# Patient Record
Sex: Male | Born: 1995 | Race: White | Hispanic: No | Smoking: Former smoker
Health system: Southern US, Community
[De-identification: ages and names within clinical notes are randomized; demographics above are authoritative.]

## PROBLEM LIST (undated history)

## (undated) DIAGNOSIS — L400 Psoriasis vulgaris: Secondary | ICD-10-CM

## (undated) DIAGNOSIS — F319 Bipolar disorder, unspecified: Secondary | ICD-10-CM

## (undated) DIAGNOSIS — F32A Depression, unspecified: Secondary | ICD-10-CM

## (undated) DIAGNOSIS — F329 Major depressive disorder, single episode, unspecified: Secondary | ICD-10-CM

## (undated) DIAGNOSIS — J45909 Unspecified asthma, uncomplicated: Secondary | ICD-10-CM

## (undated) HISTORY — PX: TONSILLECTOMY: SUR1361

## (undated) HISTORY — DX: Major depressive disorder, single episode, unspecified: F32.9

## (undated) HISTORY — PX: WISDOM TOOTH EXTRACTION: SHX21

## (undated) HISTORY — DX: Depression, unspecified: F32.A

## (undated) HISTORY — PX: ADENOIDECTOMY: SHX5191

## (undated) HISTORY — DX: Psoriasis vulgaris: L40.0

---
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
## 2017-02-07 ENCOUNTER — Encounter (HOSPITAL_COMMUNITY): Payer: Self-pay | Admitting: Emergency Medicine

## 2017-02-07 ENCOUNTER — Ambulatory Visit (HOSPITAL_COMMUNITY)
Admission: EM | Admit: 2017-02-07 | Discharge: 2017-02-07 | Disposition: A | Discharge status: Home / Self Care | Payer: Medicaid Other | Attending: Emergency Medicine | Admitting: Emergency Medicine

## 2017-02-07 ENCOUNTER — Ambulatory Visit (INDEPENDENT_AMBULATORY_CARE_PROVIDER_SITE_OTHER): Payer: Medicaid Other

## 2017-02-07 DIAGNOSIS — S8002XA Contusion of left knee, initial encounter: Secondary | ICD-10-CM | POA: Diagnosis not present

## 2017-02-07 DIAGNOSIS — M549 Dorsalgia, unspecified: Secondary | ICD-10-CM | POA: Diagnosis not present

## 2017-02-07 DIAGNOSIS — S7002XA Contusion of left hip, initial encounter: Secondary | ICD-10-CM | POA: Diagnosis not present

## 2017-02-07 DIAGNOSIS — M25562 Pain in left knee: Secondary | ICD-10-CM | POA: Diagnosis not present

## 2017-02-07 HISTORY — DX: Bipolar disorder, unspecified: F31.9
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
## 2017-05-06 DIAGNOSIS — L409 Psoriasis, unspecified: Secondary | ICD-10-CM | POA: Insufficient documentation

## 2017-08-29 DIAGNOSIS — F319 Bipolar disorder, unspecified: Secondary | ICD-10-CM | POA: Diagnosis not present

## 2017-09-30 ENCOUNTER — Emergency Department (HOSPITAL_COMMUNITY)
Admission: EM | Admit: 2017-09-30 | Discharge: 2017-09-30 | Disposition: A | Discharge status: Home / Self Care | Payer: BLUE CROSS/BLUE SHIELD | Attending: Emergency Medicine | Admitting: Emergency Medicine

## 2017-09-30 ENCOUNTER — Encounter (HOSPITAL_COMMUNITY): Payer: Self-pay | Admitting: Emergency Medicine

## 2017-09-30 DIAGNOSIS — R0902 Hypoxemia: Secondary | ICD-10-CM | POA: Diagnosis not present

## 2017-09-30 DIAGNOSIS — T40601A Poisoning by unspecified narcotics, accidental (unintentional), initial encounter: Secondary | ICD-10-CM | POA: Diagnosis not present

## 2017-09-30 DIAGNOSIS — R739 Hyperglycemia, unspecified: Secondary | ICD-10-CM

## 2017-09-30 DIAGNOSIS — J45909 Unspecified asthma, uncomplicated: Secondary | ICD-10-CM | POA: Diagnosis not present

## 2017-09-30 DIAGNOSIS — F17210 Nicotine dependence, cigarettes, uncomplicated: Secondary | ICD-10-CM | POA: Insufficient documentation

## 2017-09-30 DIAGNOSIS — Z79899 Other long term (current) drug therapy: Secondary | ICD-10-CM | POA: Diagnosis not present

## 2017-09-30 DIAGNOSIS — R0689 Other abnormalities of breathing: Secondary | ICD-10-CM | POA: Diagnosis not present

## 2017-09-30 DIAGNOSIS — I454 Nonspecific intraventricular block: Secondary | ICD-10-CM | POA: Diagnosis not present

## 2017-09-30 DIAGNOSIS — R402441 Other coma, without documented Glasgow coma scale score, or with partial score reported, in the field [EMT or ambulance]: Secondary | ICD-10-CM | POA: Diagnosis not present

## 2017-09-30 LAB — CBC WITH DIFFERENTIAL/PLATELET
ABS IMMATURE GRANULOCYTES: 0.1 10*3/uL (ref 0.0–0.1)
Basophils Absolute: 0.1 10*3/uL (ref 0.0–0.1)
Basophils Relative: 0 %
EOS ABS: 0.1 10*3/uL (ref 0.0–0.7)
Eosinophils Relative: 1 %
HEMATOCRIT: 49.3 % (ref 39.0–52.0)
HEMOGLOBIN: 16 g/dL (ref 13.0–17.0)
IMMATURE GRANULOCYTES: 1 %
LYMPHS ABS: 1.3 10*3/uL (ref 0.7–4.0)
Lymphocytes Relative: 7 %
MCH: 28.2 pg (ref 26.0–34.0)
MCHC: 32.5 g/dL (ref 30.0–36.0)
MCV: 86.8 fL (ref 78.0–100.0)
MONOS PCT: 6 %
Monocytes Absolute: 1.1 10*3/uL — ABNORMAL HIGH (ref 0.1–1.0)
NEUTROS ABS: 14.9 10*3/uL — AB (ref 1.7–7.7)
NEUTROS PCT: 85 %
PLATELETS: 239 10*3/uL (ref 150–400)
RBC: 5.68 MIL/uL (ref 4.22–5.81)
RDW: 12.8 % (ref 11.5–15.5)
WBC: 17.5 10*3/uL — AB (ref 4.0–10.5)

## 2017-09-30 LAB — ETHANOL: Alcohol, Ethyl (B): 180 mg/dL — ABNORMAL HIGH (ref <10)

## 2017-09-30 LAB — RAPID URINE DRUG SCREEN, HOSP PERFORMED
Amphetamines: NOT DETECTED
Barbiturates: NOT DETECTED
Benzodiazepines: NOT DETECTED
Cocaine: POSITIVE — AB
OPIATES: POSITIVE — AB
TETRAHYDROCANNABINOL: POSITIVE — AB

## 2017-09-30 LAB — BASIC METABOLIC PANEL
Anion gap: 13 (ref 5–15)
BUN: 7 mg/dL (ref 6–20)
CO2: 24 mmol/L (ref 22–32)
CREATININE: 1.19 mg/dL (ref 0.61–1.24)
Calcium: 8.8 mg/dL — ABNORMAL LOW (ref 8.9–10.3)
Chloride: 105 mmol/L (ref 101–111)
GFR calc Af Amer: >60 mL/min (ref >60)
Glucose, Bld: 212 mg/dL — ABNORMAL HIGH (ref 65–99)
Potassium: 3.4 mmol/L — ABNORMAL LOW (ref 3.5–5.1)
Sodium: 142 mmol/L (ref 135–145)

## 2017-09-30 MED ORDER — SODIUM CHLORIDE 0.9 % IV BOLUS
1000.0000 mL | Freq: Once | INTRAVENOUS | Status: AC
  Administered 2017-09-30: 1000 mL via INTRAVENOUS

## 2017-09-30 MED ORDER — ONDANSETRON HCL 4 MG/2ML IJ SOLN
4.0000 mg | Freq: Once | INTRAMUSCULAR | Status: AC
  Administered 2017-09-30: 4 mg via INTRAVENOUS
  Filled 2017-09-30: qty 2

## 2017-09-30 NOTE — ED Provider Notes (Signed)
[facility]   CSN: [id number] Arrival date & time: 09/30/17  0710     History   Chief Complaint Chief Complaint  Patient presents with  . Drug Overdose    HPI Kyle Woodward is a 22 y.o. male.  Level 5 caveat for urgent need for intervention.  Patient is status post heroin inhalation.  His friends found him after he fell out of bed and was unconscious.  EMS was notified.  He was given Narcan 1 mg intranasally and bagged for a few minutes.  Patient became more conscious en route to the hospital.  He also reports drinking alcohol last night.  He is more alert now     Past Medical History:  Diagnosis Date  . Asthma   . Bipolar 1 disorder (HCC)     There are no active problems to display for this patient.   Past Surgical History:  Procedure Laterality Date  . ADENOIDECTOMY    . TONSILLECTOMY          Home Medications    Prior to Admission medications   Medication Sig Start Date End Date Taking? Authorizing Provider  Adalimumab 40 MG/0.4ML PNKT Inject 40 mg into the skin every 14 (fourteen) days. 06/26/17  Yes [provider]  hydrOXYzine (ATARAX/VISTARIL) 25 MG tablet Take 25 mg by mouth daily as needed. anxiety 08/29/17   [provider]    Family History Family History  Problem Relation Age of Onset  . Diabetes Mother   . Hypertension Mother     Social History Social History   Tobacco Use  . Smoking status: Current Every Day Smoker    Types: Cigarettes  . Smokeless tobacco: Current User  Substance Use Topics  . Alcohol use: Yes    Comment: socially  . Drug use: Yes    Types: Marijuana     Allergies   Zithromax [azithromycin]   Review of Systems Review of Systems  All other systems reviewed and are negative.    Physical Exam Updated Vital Signs BP 115/72   Pulse 92   Temp 98 F (36.7 C) (Oral)   Resp 12   Ht 6\' 1"  (1.854 m)   Wt 82.6 kg (182 lb)   SpO2 99%   BMI  24.01 kg/m   Physical Exam  Constitutional: He is oriented to person, place, and time.  Easily arousable.  HENT:  Head: Normocephalic and atraumatic.  Eyes: Conjunctivae are normal.  Neck: Neck supple.  Cardiovascular: Normal rate and regular rhythm.  Pulmonary/Chest: Effort normal and breath sounds normal.  Abdominal: Soft. Bowel sounds are normal.  Musculoskeletal: Normal range of motion.  Neurological: He is alert and oriented to person, place, and time.  Skin: Skin is warm and dry.  Psychiatric: He has a normal mood and affect. His behavior is normal.  Nursing note and vitals reviewed.    ED Treatments / Results  Labs (all labs ordered are listed, but only abnormal results are displayed) Labs Reviewed  CBC WITH DIFFERENTIAL/PLATELET - Abnormal; Notable for the following components:      Result Value   WBC 17.5 (*)    Neutro Abs 14.9 (*)    Monocytes Absolute 1.1 (*)    All other components within normal limits  BASIC METABOLIC PANEL - Abnormal; Notable for the following components:   Potassium 3.4 (*)    Glucose, Bld 212 (*)    Calcium 8.8 (*)    All other components within normal limits  RAPID URINE DRUG SCREEN, HOSP PERFORMED - Abnormal; Notable for the following components:   Opiates POSITIVE (*)    Cocaine POSITIVE (*)    Tetrahydrocannabinol POSITIVE (*)    All other components within normal limits  ETHANOL - Abnormal; Notable for the following components:   Alcohol, Ethyl (B) 180 (*)    All other components within normal limits    EKG EKG Interpretation  Date/Time:  Monday September 30 2017 07:17:56 EDT Ventricular Rate:  121 PR Interval:    QRS Duration: 108 QT Interval:  329 QTC Calculation: 467 R Axis:   23 Text Interpretation:  Sinus tachycardia LAE, consider biatrial enlargement RSR' in V1 or V2, right VCD or RVH Confirmed by Donnetta Hutching ([id number]) on 09/30/2017 8:39:10 AM   Radiology No results found.  Procedures Procedures (including critical  care time)  Medications Ordered in ED Medications  sodium chloride 0.9 % bolus 1,000 mL (0 mLs Intravenous Stopped 09/30/17 0858)  sodium chloride 0.9 % bolus 1,000 mL (0 mLs Intravenous Stopped 09/30/17 1107)  ondansetron (ZOFRAN) injection 4 mg (4 mg Intravenous Given 09/30/17 1007)     Initial Impression / Assessment and Plan / ED Course  I have reviewed the triage vital signs and the nursing notes.  Pertinent labs & imaging results that were available during my care of the patient were reviewed by me and considered in my medical decision making (see chart for details).     Patient presents after an opiate overdose.  He is now stable in the emergency department.  Drug screen positive for THC, cocaine, heroin.  Alcohol level 180.  He was given IV fluids and prolonged observation.  Glucose elevated at 212.  These findings were discussed with the patient.  He was hemodynamically stable at discharge.  Final Clinical Impressions(s) / ED Diagnoses   Final diagnoses:  Opiate overdose, accidental or unintentional, initial encounter [facility])  Hyperglycemia    ED Discharge Orders    None       Donnetta Hutching, MD 10/02/17 1040

## 2017-09-30 NOTE — ED Notes (Signed)
PT asleep. PT is able to be awakened by slight touch on shoulder. PT alert and oriented.

## 2017-09-30 NOTE — ED Triage Notes (Signed)
PT snorted heroin this AM. PT's friends heard him fall out of bed. They found PT unconscious and called EMS. EMS found PT unconscious with shallow respirations. PT was given 1mg  narcan intranasally and bagged for a few minutes. PT became conscious in route. Alert and oriented x4. Also reports he had "a lot" to drink last night.

## 2017-09-30 NOTE — ED Notes (Signed)
Kandace BlitzJohn Chapman ; friend ; [phone] ; call when PT is released, he is his ride home.

## 2017-09-30 NOTE — ED Notes (Signed)
88% on room air. PT placed on O2 via [place] 2L

## 2017-09-30 NOTE — ED Notes (Signed)
PT placed on 2L O2 via [place] 

## 2017-09-30 NOTE — ED Notes (Signed)
PT wakes to voice. PT able to stand without assistance to provide urine sample. PT is not able to urinate, but does start to vomit. Dr. Adriana Simasook made aware

## 2017-09-30 NOTE — Discharge Instructions (Addendum)
No more heroin.  Your glucose was elevated today.  This will need to be rechecked in the next couple weeks.  Return if worse

## 2017-09-30 NOTE — ED Notes (Signed)
95% on room air.

## 2017-11-28 ENCOUNTER — Encounter: Payer: Self-pay | Admitting: Adult Health

## 2017-11-28 ENCOUNTER — Ambulatory Visit: Payer: BLUE CROSS/BLUE SHIELD | Admitting: Adult Health

## 2017-11-28 VITALS — BP 120/78 | Temp 98.3°F | Wt 178.0 lb

## 2017-11-28 DIAGNOSIS — F1010 Alcohol abuse, uncomplicated: Secondary | ICD-10-CM

## 2017-11-28 DIAGNOSIS — F1910 Other psychoactive substance abuse, uncomplicated: Secondary | ICD-10-CM

## 2017-11-28 DIAGNOSIS — Z7689 Persons encountering health services in other specified circumstances: Secondary | ICD-10-CM

## 2017-11-28 DIAGNOSIS — R7309 Other abnormal glucose: Secondary | ICD-10-CM

## 2017-11-28 DIAGNOSIS — J4520 Mild intermittent asthma, uncomplicated: Secondary | ICD-10-CM

## 2017-11-28 LAB — BASIC METABOLIC PANEL
BUN: 11 mg/dL (ref 6–23)
CHLORIDE: 100 meq/L (ref 96–112)
CO2: 30 meq/L (ref 19–32)
Calcium: 10.6 mg/dL — ABNORMAL HIGH (ref 8.4–10.5)
Creatinine, Ser: 0.86 mg/dL (ref 0.40–1.50)
GFR: 120.57 mL/min (ref >60.00)
GLUCOSE: 83 mg/dL (ref 70–99)
POTASSIUM: 4.4 meq/L (ref 3.5–5.1)
Sodium: 139 mEq/L (ref 135–145)

## 2017-11-28 LAB — HEMOGLOBIN A1C: Hgb A1c MFr Bld: 4.9 % (ref 4.6–6.5)

## 2017-11-28 MED ORDER — ALBUTEROL SULFATE HFA 108 (90 BASE) MCG/ACT IN AERS: 2.0000 | INHALATION_SPRAY | Freq: Four times a day (QID) | RESPIRATORY_TRACT | 0 refills | Status: DC | PRN

## 2017-11-28 NOTE — Progress Notes (Signed)
Patient presents to clinic today to establish care. He is a pleasant 22 year old male who  has a past medical history of Asthma and Bipolar 1 disorder ([place]).    Acute Concerns: Establish Care  Elevated Glucose - glucose was found to be 212 during recent hospital admission.   Chronic Issues: Drug/Alcohol Abuse - ER visit in June 2019 for opiate overdose where he was found unconscious by a friend. He was given 1 mg narcan intranasally by EMS. He became more conscious en route to hospital. Alcohol level was 180. He tested posiitve for THC, Cocaine, and heroin. Was given IV fluids and observation in the ER . Today in the office he reports that this overdose was a wake up call for him. He has not used Heroine or Coke since being discharged. He continues to smoke marijuana and use alcohol. He has not seeked treatment for drug abuse nor does he want to.   Tobacco Use - Smokes less than 1/2 pack a day. Also uses smokeless tobacco   Depression - no longer taking medication. Feels controlled. Was on Zoloft and Depakote in the past. 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 Seen by [facility]. Started Humira in Feb and has noticed significant improvement    Health Maintenance: Dental -- Does not do routine care  Vision -- Does not do routine care Immunizations -- UTD     Past Medical History:  Diagnosis Date  . Asthma   . Bipolar 1 disorder [facility])     Past Surgical History:  Procedure Laterality Date  . ADENOIDECTOMY    . TONSILLECTOMY      Current Outpatient Medications on File Prior to Visit  Medication Sig Dispense Refill  . Adalimumab 40 MG/0.4ML PNKT Inject 40 mg into the skin every 14 (fourteen) days.     No current facility-administered medications on file prior to visit.     Allergies  Allergen Reactions  . Zithromax [Azithromycin] Hives    Family History  Problem Relation Age of Onset  . Diabetes Mother   .  Hypertension Mother     Social History   Socioeconomic History  . Marital status: Single    Spouse name: Not on file  . Number of children: Not on file  . Years of education: Not on file  . Highest education level: Not on file  Occupational History  . Not on file  Social Needs  . Financial resource strain: Not on file  . Food insecurity:    Worry: Not on file    Inability: Not on file  . Transportation needs:    Medical: Not on file    Non-medical: Not on file  Tobacco Use  . Smoking status: Current Every Day Smoker    Types: Cigarettes  . Smokeless tobacco: Current User  Substance and Sexual Activity  . Alcohol use: Yes    Comment: socially  . Drug use: Yes    Types: Marijuana  . Sexual activity: Yes  Lifestyle  . Physical activity:    Days per week: Not on file    Minutes per session: Not on file  . Stress: Not on file  Relationships  . Social connections:    Talks on phone: Not on file    Gets together: Not on file    Attends religious service: Not on file    Active member of club or organization: Not on file    Attends  meetings of clubs or organizations: Not on file    Relationship status: Not on file  . Intimate partner violence:    Fear of current or ex partner: Not on file    Emotionally abused: Not on file    Physically abused: Not on file    Forced sexual activity: Not on file  Other Topics Concern  . Not on file  Social History Narrative  . Not on file    Review of Systems  Constitutional: Negative.   Respiratory: Negative.   Cardiovascular: Negative.   Genitourinary: Negative.   Musculoskeletal: Negative.   Skin: Negative.   Neurological: Negative.   Psychiatric/Behavioral: Negative.      BP 120/78   Temp 98.3 F (36.8 C) (Oral)   Wt 178 lb (80.7 kg)   BMI 23.48 kg/m   Physical Exam  Constitutional: He is oriented to person, place, and time. He appears well-developed and well-nourished. No distress.  Eyes: Pupils are equal, round,  and reactive to light. EOM are normal.  Cardiovascular: Normal rate, regular rhythm, normal heart sounds and intact distal pulses. Exam reveals no gallop and no friction rub.  No murmur heard. Pulmonary/Chest: Effort normal and breath sounds normal. No stridor. No respiratory distress. He has no wheezes. He has no rales. He exhibits no tenderness.  Abdominal: Soft. Bowel sounds are normal. He exhibits no distension and no mass. There is no tenderness. There is no rebound and no guarding. No hernia.  Musculoskeletal: Normal range of motion. He exhibits no edema, tenderness or deformity.  Neurological: He is alert and oriented to person, place, and time.  Skin: Skin is warm and dry. He is not diaphoretic.  Psychiatric: He has a normal mood and affect. His behavior is normal. Judgment and thought content normal.  Nursing note and vitals reviewed.     Recent Results (from the past 2160 hour(s))  CBC with Differential     Status: Abnormal   Collection Time: 09/30/17  7:55 AM  Result Value Ref Range   WBC 17.5 (H) 4.0 - 10.5 K/uL   RBC 5.68 4.22 - 5.81 MIL/uL   Hemoglobin 16.0 13.0 - 17.0 g/dL   HCT 49.3 39.0 - 52.0 %   MCV 86.8 78.0 - 100.0 fL   MCH 28.2 26.0 - 34.0 pg   MCHC 32.5 30.0 - 36.0 g/dL   RDW 12.8 11.5 - 15.5 %   Platelets 239 150 - 400 K/uL   Neutrophils Relative % 85 %   Neutro Abs 14.9 (H) 1.7 - 7.7 K/uL   Lymphocytes Relative 7 %   Lymphs Abs 1.3 0.7 - 4.0 K/uL   Monocytes Relative 6 %   Monocytes Absolute 1.1 (H) 0.1 - 1.0 K/uL   Eosinophils Relative 1 %   Eosinophils Absolute 0.1 0.0 - 0.7 K/uL   Basophils Relative 0 %   Basophils Absolute 0.1 0.0 - 0.1 K/uL   Immature Granulocytes 1 %   Abs Immature Granulocytes 0.1 0.0 - 0.1 K/uL    Comment: Performed at [facility], [address], [place], [place] [address]  Basic metabolic panel     Status: Abnormal   Collection Time: 09/30/17  7:55 AM  Result Value Ref Range   Sodium 142 135 - 145 mmol/L   Potassium  3.4 (L) 3.5 - 5.1 mmol/L   Chloride 105 101 - 111 mmol/L   CO2 24 22 - 32 mmol/L   Glucose, Bld 212 (H) 65 - 99 mg/dL   BUN 7  6 - 20 mg/dL   Creatinine, Ser 1.19 0.61 - 1.24 mg/dL   Calcium 8.8 (L) 8.9 - 10.3 mg/dL   GFR calc non Af Amer >60 >60 mL/min   GFR calc Af Amer >60 >60 mL/min    Comment: (NOTE) The eGFR has been calculated using the CKD EPI equation. This calculation has not been validated in all clinical situations. eGFR's persistently <60 mL/min signify possible Chronic Kidney Disease.    Anion gap 13 5 - 15    Comment: Performed at [facility] [address], [place], [place] [address]  Ethanol     Status: Abnormal   Collection Time: 09/30/17  7:55 AM  Result Value Ref Range   Alcohol, Ethyl (B) 180 (H) <10 mg/dL    Comment: (NOTE) Lowest detectable limit for serum alcohol is 10 mg/dL. For medical purposes only. Performed at [facility], [place] [address] [place], [place] [address]   Urine rapid drug screen (hosp performed)     Status: Abnormal   Collection Time: 09/30/17 11:57 AM  Result Value Ref Range   Opiates POSITIVE (A) NONE DETECTED   Cocaine POSITIVE (A) NONE DETECTED   Benzodiazepines NONE DETECTED NONE DETECTED   Amphetamines NONE DETECTED NONE DETECTED   Tetrahydrocannabinol POSITIVE (A) NONE DETECTED   Barbiturates NONE DETECTED NONE DETECTED    Comment: (NOTE) DRUG SCREEN FOR MEDICAL PURPOSES ONLY.  IF CONFIRMATION IS NEEDED FOR ANY PURPOSE, NOTIFY LAB WITHIN 5 DAYS. LOWEST DETECTABLE LIMITS FOR URINE DRUG SCREEN Drug Class                     Cutoff (ng/mL) Amphetamine and metabolites    1000 Barbiturate and metabolites    200 Benzodiazepine                 637 Tricyclics and metabolites     300 Opiates and metabolites        300 Cocaine and metabolites        300 THC                            50 Performed at [facility], [place] [address], [place], [place] [address]     Assessment/Plan: 1. Encounter to establish  care - Follow up for CPE sooner if needed -Average lifestyle modifications such as routine exercise and heart healthy diet  2. Elevated glucose - likely from ingestion.  - Basic Metabolic Panel - Hemoglobin A1c  3. Drug abuse ([place]) - He does not want information on substance abuse counseling.  Was encouraged to follow-up if he ever would like to seek treatment.  Encouraged to quit smoking marijuana as this is considered a substance.  Congratulated on being free of heroin and cocaine.  4. Alcohol abuse -Urged to stop drinking alcohol as he is under 21 and this just can lead to other substance abuse.  5. Mild intermittent asthma without complication -Urged to quit recurrence and smokeless tobacco products.  Will provide albuterol inhaler for symptomatic asthma symptoms when needed. - albuterol (PROVENTIL HFA;VENTOLIN HFA) 108 (90 Base) MCG/ACT inhaler; Inhale 2 puffs into the lungs every 6 (six) hours as needed for wheezing or shortness of breath.  Dispense: 1 Inhaler; Refill: 0  Dorothyann Peng, NP\

## 2017-11-28 NOTE — Patient Instructions (Signed)
It was great meeting you today   Please work on quitting smoking and dipping.   I will follow up with you regarding your blood work   Please let me know if you need anything

## 2018-01-01 ENCOUNTER — Ambulatory Visit: Payer: BLUE CROSS/BLUE SHIELD | Admitting: Adult Health

## 2018-01-01 DIAGNOSIS — Z0289 Encounter for other administrative examinations: Secondary | ICD-10-CM

## 2018-01-30 ENCOUNTER — Encounter: Payer: Self-pay | Admitting: Adult Health

## 2018-01-30 ENCOUNTER — Ambulatory Visit (INDEPENDENT_AMBULATORY_CARE_PROVIDER_SITE_OTHER): Payer: BLUE CROSS/BLUE SHIELD | Admitting: Adult Health

## 2018-01-30 ENCOUNTER — Telehealth: Payer: Self-pay | Admitting: Adult Health

## 2018-01-30 VITALS — BP 138/82 | HR 92 | Temp 98.6°F | Wt 191.6 lb

## 2018-01-30 DIAGNOSIS — J069 Acute upper respiratory infection, unspecified: Secondary | ICD-10-CM | POA: Diagnosis not present

## 2018-01-30 MED ORDER — PREDNISONE 10 MG PO TABS
ORAL_TABLET | ORAL | 0 refills | Status: DC
Start: 2018-01-30 — End: 2018-03-18

## 2018-01-30 MED ORDER — DOXYCYCLINE HYCLATE 100 MG PO CAPS: 100.0000 mg | ORAL_CAPSULE | Freq: Two times a day (BID) | ORAL | 0 refills | Status: DC

## 2018-01-30 NOTE — Telephone Encounter (Signed)
Delsym or Mucinex is fine. He was given prednisone which will help with the cough.

## 2018-01-30 NOTE — Progress Notes (Signed)
Subjective:    Patient ID: Kyle Woodward, male    DOB: 08-22-1995, 22 y.o.   MRN: [id number]  URI   This is a new problem. The current episode started in the past 7 days. The problem has been gradually worsening. The maximum temperature recorded prior to his arrival was 100.4 - 100.9 F (subjective ). Associated symptoms include congestion, coughing (productive ), rhinorrhea and wheezing. Pertinent negatives include no nausea, plugged ear sensation or sinus pain. Treatments tried: low dose prednisone left over  The treatment provided no relief.      Review of Systems  Constitutional: Positive for activity change, appetite change, chills, diaphoresis, fatigue and fever.  HENT: Positive for congestion and rhinorrhea. Negative for sinus pressure, sinus pain and trouble swallowing.   Respiratory: Positive for cough (productive ), chest tightness, shortness of breath and wheezing.   Cardiovascular: Negative.   Gastrointestinal: Negative for nausea.   Past Medical History:  Diagnosis Date  . Asthma   . Bipolar 1 disorder ([facility])   . Depression   . Plaque psoriasis     Social History   Socioeconomic History  . Marital status: Single    Spouse name: Not on file  . Number of children: Not on file  . Years of education: Not on file  . Highest education level: Not on file  Occupational History  . Not on file  Social Needs  . Financial resource strain: Not on file  . Food insecurity:    Worry: Not on file    Inability: Not on file  . Transportation needs:    Medical: Not on file    Non-medical: Not on file  Tobacco Use  . Smoking status: Current Every Day Smoker    Types: Cigarettes  . Smokeless tobacco: Never Used  Substance and Sexual Activity  . Alcohol use: Yes    Comment: socially  . Drug use: Yes    Types: Marijuana  . Sexual activity: Yes    Partners: Female  Lifestyle  . Physical activity:    Days per week: Not on file    Minutes per session: Not on file  .  Stress: Not on file  Relationships  . Social connections:    Talks on phone: Not on file    Gets together: Not on file    Attends religious service: Not on file    Active member of club or organization: Not on file    Attends meetings of clubs or organizations: Not on file    Relationship status: Not on file  . Intimate partner violence:    Fear of current or ex partner: Not on file    Emotionally abused: Not on file    Physically abused: Not on file    Forced sexual activity: Not on file  Other Topics Concern  . Not on file  Social History Narrative  . Not on file    Past Surgical History:  Procedure Laterality Date  . ADENOIDECTOMY    . TONSILLECTOMY    . WISDOM TOOTH EXTRACTION      Family History  Problem Relation Age of Onset  . Diabetes Mother   . Hypertension Mother   . Alcohol abuse Mother   . Depression Mother   . Hyperlipidemia Mother   . Alcohol abuse Father   . Depression Father   . Hypertension Father   . Arthritis Maternal Grandmother   . Hyperlipidemia Maternal Grandmother   . Hypertension Maternal Grandmother     Allergies  Allergen Reactions  . Influenza Vaccines     Local reaction to injection site - swelling Overall feeling bad after getting the vaccine  . Zithromax [Azithromycin] Hives    Current Outpatient Medications on File Prior to Visit  Medication Sig Dispense Refill  . Adalimumab 40 MG/0.4ML PNKT Inject 40 mg into the skin every 14 (fourteen) days.    Marland Kitchen albuterol (PROVENTIL HFA;VENTOLIN HFA) 108 (90 Base) MCG/ACT inhaler Inhale 2 puffs into the lungs every 6 (six) hours as needed for wheezing or shortness of breath. 1 Inhaler 0   No current facility-administered medications on file prior to visit.     BP 138/82 (BP Location: Right Arm, Patient Position: Sitting, Cuff Size: Normal)   Pulse 92   Temp 98.6 F (37 C) (Oral)   Wt 191 lb 9.6 oz (86.9 kg)   SpO2 97%   BMI 25.28 kg/m       Objective:   Physical Exam    Constitutional: He is oriented to person, place, and time. He appears well-developed and well-nourished. He appears ill. No distress.  HENT:  Head: Normocephalic and atraumatic.  Right Ear: External ear normal.  Left Ear: External ear normal.  Nose: Nose normal.  Mouth/Throat: Oropharynx is clear and moist. No oropharyngeal exudate.  Eyes: Pupils are equal, round, and reactive to light. Conjunctivae and EOM are normal. Right eye exhibits no discharge. Left eye exhibits no discharge. No scleral icterus.  Cardiovascular: Normal rate, regular rhythm, normal heart sounds and intact distal pulses.  Pulmonary/Chest: Effort normal. No stridor. No respiratory distress. He has wheezes (throughout ). He has no rhonchi. He has no rales. He exhibits no tenderness.  Musculoskeletal: Normal range of motion.  Neurological: He is alert and oriented to person, place, and time.  Skin: Skin is warm and dry. He is not diaphoretic.  Nursing note and vitals reviewed.     Assessment & Plan:  1. Upper respiratory tract infection, unspecified type - We talked about getting a chest xray but it would not change my treatment plan. Decided not to. Will treat with Doxycycline and Prednisone.  - doxycycline (VIBRAMYCIN) 100 MG capsule; Take 1 capsule (100 mg total) by mouth 2 (two) times daily.  Dispense: 14 capsule; Refill: 0 - predniSONE (DELTASONE) 10 MG tablet; 40 mg x 3 days, 20 mg x 3 days, 10 mg x 3 days  Dispense: 21 tablet; Refill: 0 - Follow up in 2-3 days if no improvement    Shirline Frees, NP

## 2018-01-30 NOTE — Telephone Encounter (Signed)
Copied from CRM [id number]. Topic: Quick Communication - See Telephone Encounter >> Jan 30, 2018  3:32 PM Lorayne Bender wrote: CRM for notification. See Telephone encounter for: 01/30/18.  Pt's mother, Judeth Cornfield, calling in.  States that she wants to know what pt can take for his cough.  Pt was seen and given medications, but nothing for cough. Judeth Cornfield can be reached at [phone].

## 2018-01-30 NOTE — Telephone Encounter (Signed)
Spoke to [place] (mother/DPR on file) and informed her to try OTC Delsym or Mucinex.  No further action required.

## 2018-03-12 ENCOUNTER — Encounter (HOSPITAL_COMMUNITY): Payer: Self-pay | Admitting: Emergency Medicine

## 2018-03-12 ENCOUNTER — Emergency Department (HOSPITAL_COMMUNITY)
Admission: EM | Admit: 2018-03-12 | Discharge: 2018-03-12 | Disposition: A | Discharge status: Home / Self Care | Payer: BLUE CROSS/BLUE SHIELD | Attending: Emergency Medicine | Admitting: Emergency Medicine

## 2018-03-12 ENCOUNTER — Encounter: Payer: Self-pay | Admitting: Emergency Medicine

## 2018-03-12 ENCOUNTER — Emergency Department (HOSPITAL_COMMUNITY): Payer: BLUE CROSS/BLUE SHIELD

## 2018-03-12 ENCOUNTER — Other Ambulatory Visit: Payer: Self-pay

## 2018-03-12 DIAGNOSIS — R4182 Altered mental status, unspecified: Secondary | ICD-10-CM | POA: Diagnosis not present

## 2018-03-12 DIAGNOSIS — T401X1A Poisoning by heroin, accidental (unintentional), initial encounter: Secondary | ICD-10-CM

## 2018-03-12 DIAGNOSIS — Y92810 Car as the place of occurrence of the external cause: Secondary | ICD-10-CM | POA: Insufficient documentation

## 2018-03-12 DIAGNOSIS — R4020 Unspecified coma: Secondary | ICD-10-CM | POA: Diagnosis not present

## 2018-03-12 LAB — COMPREHENSIVE METABOLIC PANEL
ALBUMIN: 4.5 g/dL (ref 3.5–5.0)
ALK PHOS: 66 U/L (ref 38–126)
ALT: 18 U/L (ref 0–44)
ANION GAP: 15 (ref 5–15)
AST: 21 U/L (ref 15–41)
BUN: 7 mg/dL (ref 6–20)
CHLORIDE: 102 mmol/L (ref 98–111)
CO2: 18 mmol/L — AB (ref 22–32)
Calcium: 8.8 mg/dL — ABNORMAL LOW (ref 8.9–10.3)
Creatinine, Ser: 1.23 mg/dL (ref 0.61–1.24)
GFR calc non Af Amer: >60 mL/min (ref >60)
GLUCOSE: 282 mg/dL — AB (ref 70–99)
Potassium: 3.2 mmol/L — ABNORMAL LOW (ref 3.5–5.1)
SODIUM: 135 mmol/L (ref 135–145)
Total Bilirubin: 0.4 mg/dL (ref 0.3–1.2)
Total Protein: 7.5 g/dL (ref 6.5–8.1)

## 2018-03-12 LAB — CBC WITH DIFFERENTIAL/PLATELET
Abs Immature Granulocytes: 0.06 10*3/uL (ref 0.00–0.07)
BASOS ABS: 0.1 10*3/uL (ref 0.0–0.1)
BASOS PCT: 1 %
EOS PCT: 1 %
Eosinophils Absolute: 0.1 10*3/uL (ref 0.0–0.5)
HCT: 51.2 % (ref 39.0–52.0)
HEMOGLOBIN: 15.7 g/dL (ref 13.0–17.0)
Immature Granulocytes: 0 %
LYMPHS PCT: 36 %
Lymphs Abs: 5 10*3/uL — ABNORMAL HIGH (ref 0.7–4.0)
MCH: 27.4 pg (ref 26.0–34.0)
MCHC: 30.7 g/dL (ref 30.0–36.0)
MCV: 89.4 fL (ref 80.0–100.0)
MONO ABS: 1.1 10*3/uL — AB (ref 0.1–1.0)
Monocytes Relative: 8 %
NEUTROS ABS: 7.7 10*3/uL (ref 1.7–7.7)
Neutrophils Relative %: 54 %
PLATELETS: 328 10*3/uL (ref 150–400)
RBC: 5.73 MIL/uL (ref 4.22–5.81)
RDW: 13.6 % (ref 11.5–15.5)
WBC: 14 10*3/uL — AB (ref 4.0–10.5)
nRBC: 0 % (ref 0.0–0.2)

## 2018-03-12 LAB — RAPID URINE DRUG SCREEN, HOSP PERFORMED
Amphetamines: NOT DETECTED
BARBITURATES: NOT DETECTED
Benzodiazepines: POSITIVE — AB
COCAINE: POSITIVE — AB
OPIATES: NOT DETECTED
TETRAHYDROCANNABINOL: POSITIVE — AB

## 2018-03-12 LAB — ETHANOL: Alcohol, Ethyl (B): 107 mg/dL — ABNORMAL HIGH (ref <10)

## 2018-03-12 LAB — ACETAMINOPHEN LEVEL: Acetaminophen (Tylenol), Serum: <10 ug/mL — ABNORMAL LOW (ref 10–30)

## 2018-03-12 LAB — SALICYLATE LEVEL: Salicylate Lvl: <7 mg/dL (ref 2.8–30.0)

## 2018-03-12 MED ORDER — NALOXONE HCL 2 MG/2ML IJ SOSY
2.0000 mg | PREFILLED_SYRINGE | Freq: Once | INTRAMUSCULAR | Status: AC
  Administered 2018-03-12: 2 mg via INTRAVENOUS

## 2018-03-12 MED ORDER — NALOXONE HCL 4 MG/0.1ML NA LIQD: NASAL | 0 refills | Status: DC

## 2018-03-12 MED ORDER — SODIUM CHLORIDE 0.9 % IV BOLUS
1000.0000 mL | Freq: Once | INTRAVENOUS | Status: AC
  Administered 2018-03-12: 1000 mL via INTRAVENOUS

## 2018-03-12 MED ORDER — NALOXONE HCL 2 MG/2ML IJ SOSY
PREFILLED_SYRINGE | INTRAMUSCULAR | Status: AC
  Filled 2018-03-12: qty 2

## 2018-03-12 MED ORDER — SODIUM CHLORIDE 0.9 % IV BOLUS (SEPSIS)
1000.0000 mL | Freq: Once | INTRAVENOUS | Status: AC
  Administered 2018-03-12: 1000 mL via INTRAVENOUS

## 2018-03-12 MED ORDER — NALOXONE HCL 4 MG/0.1ML NA LIQD
1.0000 | Freq: Once | NASAL | Status: AC
  Administered 2018-03-12: 1 via NASAL
  Filled 2018-03-12: qty 4

## 2018-03-12 MED ORDER — NALOXONE HCL 4 MG/0.1ML NA LIQD: 1.0000 | Freq: Once | NASAL | Status: DC

## 2018-03-12 NOTE — ED Notes (Signed)
Dad at bedside. MD spoke to patient and family and advised them they would be waiting until around 9am.

## 2018-03-12 NOTE — Discharge Instructions (Addendum)
Substance Abuse Treatment Programs ° °Intensive Outpatient Programs °[facility]     °[address]      °[place], [place]                   °[phone]      ° °[facility] °[address] °[place], [place] °[phone] ° °[facility]     °(Inpatient and outpatient)     °[address]           °[phone]   ° °[facility] °[phone] (Suboxone and Methadone) ° °[address]      °[place], [place] [address]      °[phone]      ° °[address] °[place], [place] °[phone] ° °[facility] (Outpatient/Inpatient, [facility])    °(insurance only) [phone]      °       °[facility]Groups & Residential) °[place], [place] °[phone] ° °   °[facility]     °[address]     °[place], [place]      °[phone]      ° °[facility] (for caregivers and family) °[address] °[place], [place] °336-299-4655 ° ° ° ° ° °Residential Treatment Programs °[facility]      °[address], [place], [place] [address]  °[phone]      ° °[facility] °[address] [place], [place] [address] °[phone] ° °[facility]        °[phone]      ° °[facility] °[phone] ° °[facility]             °[address]                                         °[place], [place]                                                °[phone] or 336-784-9470                              ° °[facility] [place] °[address] °[place], [address] °[phone] ° °[facility]    °[address]      °[place], [place]     °[phone]      ° °[facility] °[address] °, [place] °[phone] ° °[facility]   °[address]     °[place], [place] [address]     °[phone]      °Admissions: 8am-3pm M-F ° °[facility] °[address] °[place],  [place] °[phone] ° °[facility]: Residential Program (90 Days)   °[place], [place]      °336-725-8389 or [phone]    ° °[facility] °[place], [place] °(Walk in Hours over the weekend or by referral) ° °[facility] °[address], [place], [place] [address] °([phone] ° °Crisis Mobile: [facility]:  1-877-626-1772 (for crisis response 24 hours a day) °[facility] Hotline:      1-800-256-2452 °Outpatient Psychiatry and Counseling ° °[facility]: Mobile Crisis   Management 24 hours:  1-877-626-1772 ° °[facility] sliding scale fee and walk in schedule: M-F 8am-12pm/1pm-3pm °[address]  °[place], [place] [address] °[phone] ° °[facility] °[address] °[place], [place] [address] °[phone] ° °[facility] ([facility])- new patient walk-in appointments available Monday - Friday 8am -3pm.          °[address] °[place], [place] [address] °336-676-6840 or crisis line- 336-676-6905 ° °[facility] Intensive Outpatient Therapy Program °[address] °[place], [place] [address] °336-832-9804 ° °[facility]                  °Crisis Services      °[phone]      °[address]     °[place], [place] [address]                ° °[facility]   °[facility] °[phone] °[address] °[place], [place] [address] ° ° °[facility]          °[address]  °[place], [place] [address]       °([phone] ° °[facility] °[address] °[place], [place] [address] °336-292-1510 ° °[facility]    °[address]    °[place], [place] [address]     °[phone]      ° °Parish McKinney, MD     °[address]     °[place] [address]     °336-282-1251     °  °[facility] °[address] °[place] [address] ° °[facility]     °[address]     °[place], [place]      °336-542-2076      ° °[facility] °Shamsher Ahluwalia, MD °[address] °[place], [place] [address] °[phone] ° °[facility]     °[address]     °[place], [place] [address]     °336-274-1237      ° °[facility] °[address] °[place], [place] [address] °336-854-4450 °Resources for Temporary Residential Assistance/Crisis Centers ° °DAY CENTERS °[facility] °M-F 8am-3pm   °[address] [place], [place] [address]   336-332-0824 °Services include: laundry, barbering, support groups, case management, phone  & computer access, showers, AA/NA mtgs, mental health/substance abuse nurse, job skills class, disability information, VA assistance, spiritual classes, etc.  ° °HOMELESS SHELTERS ° °[facility]     °[facility]   °[address], [place]     °336.271.5959       °       °[facility] (women and children)       °[address] °[place], [place] [address] °336-275-0820 °[email or web address] for application and process °Application Required ° °[facility]   °[address]    °[place] [address]     °[phone]       °             °[facility] °[address] °, [place] [address] °336.273.5572 °336-235-0363(schedule application appt.) °Application Required ° °[facility] (women only)    °[address]     °[place],  [address]     °336-884-1039      °  Intake starts 6pm daily °Need valid ID, SSC, & Police report °[facility] [place] °[address] °[place], [place] °336-881-5420 °Application Required ° °[facility] (men only)     °[address]      °[place], [place]     °[phone]      ° °[facility] °(Pregnant women only) °[address] °[place], [place] °336-275-0206 ° °[facility]      °[address]      °[place], [place] [address]     °[phone]      °       °[facility] °[address] °[place], [place] °336-723-1848 °90 day commitment/SA/Application process ° °[facility](men only)     °[address]     °[place], [place]     °[phone]       °Check-in at 7pm     °       °[facility] °[address] °[place], [place] [address] °336-248-6684 °Men/Women/Women and Children must be there by 7 pm ° °[facility] °[place], [place] °336-722-8721                ° °

## 2018-03-12 NOTE — ED Provider Notes (Signed)
Patient is excepted from Dr. Bebe ShaggyWickline at signout.  Heroin overdose.  Patient has been stable.  Plan is to observe until 9 AM.  Discharge planning will be for intranasal Narcan.  Patient is to return home with his mother who will help observe him.  Will recheck prior to planned discharge. Physical Exam  BP 124/67   Pulse (!) 105   Temp 98.4 F (36.9 C) (Oral)   Resp 12   Ht 6\' 1"  (1.854 m)   Wt 83.9 kg   SpO2 99%   BMI 24.41 kg/m   Physical Exam Recheck 08: 55 Patient is alert and appropriate.  Vital signs are stable.  No respiratory distress.  Parents are at bedside. ED Course/Procedures     Procedures  MDM  Patient's parents requesting results of drug screen.  This is still pending.  Patient gives permission for sharing results.  Will attached to discharge instructions.       Arby BarrettePfeiffer, Moksha Dorgan, MD 03/12/18 [phone]

## 2018-03-12 NOTE — ED Triage Notes (Signed)
Patient pulled from car, apneic, cyanotic.  Friends brought him to ED and stated that he had taken some oxycodone.  Ventilations being assisted upon arrival.

## 2018-03-12 NOTE — ED Notes (Signed)
Pt and mother given results of UDS- ok to share per pt. Instructions given on Narcan administration to mother-

## 2018-03-12 NOTE — ED Provider Notes (Signed)
[facility] Provider Note   CSN: [id number] Arrival date & time: 03/12/18  0421     History   Chief Complaint Chief Complaint  Patient presents with  . Drug Overdose   Level 5 caveat due to altered mental status HPI Kyle Woodward is a 22 y.o. male.  The history is provided by a friend.  Drug Overdose  This is a new problem. Episode onset: unKnown. The problem occurs constantly. The problem has been rapidly worsening. Nothing aggravates the symptoms. Nothing relieves the symptoms.  Patient presents for probable overdose.  Patient was pulled from the car.  He was noted to be apneic and cyanotic.  Friends who brought him to the ED reported him taking oxycodone and other drugs.  No other details are known at this time.  PMH-unknown Soc hx - unknown Home Medications    Prior to Admission medications   Not on File    Family History No family history on file.  Social History Social History   Tobacco Use  . Smoking status: Unknown If Ever Smoked  . Smokeless tobacco: Never Used  Substance Use Topics  . Alcohol use: Not on file  . Drug use: Yes     Allergies   Patient has no known allergies.   Review of Systems Review of Systems  Unable to perform ROS: Mental status change     Physical Exam Updated Vital Signs BP 130/70   Pulse (!) 107   Resp (!) 21   Ht 1.854 m (6\' 1" )   Wt 83.9 kg   SpO2 100%   BMI 24.41 kg/m   Physical Exam CONSTITUTIONAL: Disheveled, ill-appearing HEAD: Normocephalic/atraumatic EYES: Pupils pinpoint bilaterally ENMT: Mucous membranes moist NECK: supple no meningeal signs SPINE/BACK:entire spine nontender CV: S1/S2 noted, no murmurs/rubs/gallops noted LUNGS: Patient apneic, no spontaneous respirations ABDOMEN: soft, nondistended GU: Normal appearance, no sign of trauma, nursing present exam NEURO: Pt is unresponsive EXTREMITIES: pulses normal/equal, full ROM SKIN: Diaphoretic PSYCH:  Unable to assess  ED Treatments / Results  Labs (all labs ordered are listed, but only abnormal results are displayed) Labs Reviewed  CBC WITH DIFFERENTIAL/PLATELET - Abnormal; Notable for the following components:      Result Value   WBC 14.0 (*)    Lymphs Abs 5.0 (*)    Monocytes Absolute 1.1 (*)    All other components within normal limits  COMPREHENSIVE METABOLIC PANEL - Abnormal; Notable for the following components:   Potassium 3.2 (*)    CO2 18 (*)    Glucose, Bld 282 (*)    Calcium 8.8 (*)    All other components within normal limits  ACETAMINOPHEN LEVEL - Abnormal; Notable for the following components:   Acetaminophen (Tylenol), Serum <10 (*)    All other components within normal limits  ETHANOL - Abnormal; Notable for the following components:   Alcohol, Ethyl (B) 107 (*)    All other components within normal limits  SALICYLATE LEVEL  RAPID URINE DRUG SCREEN, HOSP PERFORMED    EKG EKG Interpretation  Date/Time:  Wednesday March 12 2018 04:47:53 EST Ventricular Rate:  133 PR Interval:    QRS Duration: 100 QT Interval:  301 QTC Calculation: 448 R Axis:   142 Text Interpretation:  Sinus tachycardia Right axis deviation Borderline T abnormalities, inferior leads Interpretation limited secondary to artifact No previous ECGs available Confirmed by Zadie RhineWickline, Laquasia Pincus ([id number]) on 03/12/2018 4:55:11 AM   Radiology Dg Chest Port 1 View  Result Date: 03/12/2018 CLINICAL  DATA:  Patient was found unresponsive.  Oxycodone use. EXAM: PORTABLE CHEST 1 VIEW COMPARISON:  None. FINDINGS: The heart size and mediastinal contours are within normal limits. Both lungs are clear. The visualized skeletal structures are unremarkable. IMPRESSION: No active disease. Electronically Signed   By: Burman Nieves M.D.   On: 03/12/2018 04:43    Procedures Procedures  CRITICAL CARE Performed by: Joya Gaskins Total critical care time: 35 minutes Critical care time was exclusive of  separately billable procedures and treating other patients. Critical care was necessary to treat or prevent imminent or life-threatening deterioration. Critical care was time spent personally by me on the following activities: development of treatment plan with patient and/or surrogate as well as nursing, discussions with consultants, evaluation of patient's response to treatment, examination of patient, obtaining history from patient or surrogate, ordering and performing treatments and interventions, ordering and review of laboratory studies, ordering and review of radiographic studies, pulse oximetry and re-evaluation of patient's condition. Patient with overdose that required monitoring, Narcan.  Medications Ordered in ED Medications  naloxone (NARCAN) nasal spray 4 mg/0.1 mL (has no administration in time range)  naloxone [facility]) injection 2 mg (2 mg Intravenous Given 03/12/18 0426)  sodium chloride 0.9 % bolus 1,000 mL (0 mLs Intravenous Stopped 03/12/18 0554)  sodium chloride 0.9 % bolus 1,000 mL (0 mLs Intravenous Stopped 03/12/18 0645)     Initial Impression / Assessment and Plan / ED Course  I have reviewed the triage vital signs and the nursing notes.  Pertinent labs & imaging results that were available during my care of the patient were reviewed by me and considered in my medical decision making (see chart for details).     4:41 AM Patient seen on arrival as he was being wheeled in the room after being found in a car unresponsive.  Concern for opiate overdose.  Patient was apneic and cyanotic.  Patient was given Narcan, and his respirations improved.  Will follow closely 4:55 AM Patient is now more alert.  He admits to snorting heroin, but he suspects it was laced with something else.  He does report previous history of overdose.  He is tachycardic, but is otherwise in no acute distress.  We will continue to monitor 7:21 AM Patient is improved.  Heart rate improved.  He is  dehydrated. No SI reported.  Mother at bedside, and with patient's permission I spoke to her about his care. Plan will be to monitor to about 9 AM.  If no further apneic episodes he can be discharged.  He will be discharged with Narcan and care of his mother. Resources given to patient. Signed out to Dr. Donnald Garre at signout in the ED  Final Clinical Impressions(s) / ED Diagnoses   Final diagnoses:  Accidental overdose of heroin, initial encounter [facility])    ED Discharge Orders         Ordered    naloxone [facility]) nasal spray 4 mg/0.1 mL     03/12/18 0701           Zadie Rhine, MD 03/12/18 [phone]

## 2018-03-18 ENCOUNTER — Ambulatory Visit (INDEPENDENT_AMBULATORY_CARE_PROVIDER_SITE_OTHER): Payer: BLUE CROSS/BLUE SHIELD | Admitting: Adult Health

## 2018-03-18 ENCOUNTER — Encounter: Payer: Self-pay | Admitting: Adult Health

## 2018-03-18 VITALS — BP 124/78 | Temp 98.5°F | Wt 191.0 lb

## 2018-03-18 DIAGNOSIS — T50901D Poisoning by unspecified drugs, medicaments and biological substances, accidental (unintentional), subsequent encounter: Secondary | ICD-10-CM | POA: Diagnosis not present

## 2018-03-18 DIAGNOSIS — F1910 Other psychoactive substance abuse, uncomplicated: Secondary | ICD-10-CM

## 2018-03-18 NOTE — Progress Notes (Signed)
Subjective:    Patient ID: Kyle Woodward, male    DOB: [birth year], 22 y.o.   MRN: [id number]  HPI  22 year old male who  has a past medical history of Asthma, Bipolar 1 disorder ([place]), Depression, and Plaque psoriasis.  His stepmom is with him at this visit today  He presents to the office today for follow up after recent ER visit for accidental overdose of heroine six days ago.  He has had multiple ER visits in the past due to polysubstance abuse.  Most recent prior to this visit was June 2019.  ER note on 03/12/2018 she presented to the emergency room at approximately 4 AM, was pulled from the car outside the emergency room and noted to be apneic and cyanotic.  His friends brought him to the ED after reported him taking oxycodone and other drugs.  Is given a dose of Narcan as his respirations were improved he was then followed closely.  When he was more alert he had admitted to snorting heroin but suspected that it was laced with something else.  Released from the emergency room around 9 AM that morning in the care of his mother and discharged with Narcan.  Screen was positive for cocaine, benzos, and marijuana.  Alcohol level was 109.  Blood glucose was 282 and potassium was 3.2  Today in the office he reports that he is feeling back to baseline.  He reports that he was with friends that he had not seen in a while and ended up doing multiple drugs.  Per patient "I just fucked up and made a mistake."   His family is very worried about him and are concerned that the next time this happens that he will be called in to identify the body.  Would like him to get help with treatment, unfortunately [facility] refuses any inpatient treatment at this time.  He is currently seen by psychiatry but his psychiatrist does not have an appointment until January.  His mother is going to continue to call and see if they can get him in sooner  Has no acute complaints today  Review of Systems See HPI   Past  Medical History:  Diagnosis Date  . Asthma   . Bipolar 1 disorder ([place])   . Depression   . Plaque psoriasis     Social History   Socioeconomic History  . Marital status: Single    Spouse name: Not on file  . Number of children: Not on file  . Years of education: Not on file  . Highest education level: Not on file  Occupational History  . Not on file  Social Needs  . Financial resource strain: Not on file  . Food insecurity:    Worry: Not on file    Inability: Not on file  . Transportation needs:    Medical: Not on file    Non-medical: Not on file  Tobacco Use  . Smoking status: Current Every Day Smoker    Types: Cigarettes  . Smokeless tobacco: Never Used  Substance and Sexual Activity  . Alcohol use: Yes    Comment: socially  . Drug use: Yes    Types: Marijuana, Heroin, Oxycodone, Codeine  . Sexual activity: Yes    Partners: Female  Lifestyle  . Physical activity:    Days per week: Not on file    Minutes per session: Not on file  . Stress: Not on file  Relationships  . Social connections:  Talks on phone: Not on file    Gets together: Not on file    Attends religious service: Not on file    Active member of club or organization: Not on file    Attends meetings of clubs or organizations: Not on file    Relationship status: Not on file  . Intimate partner violence:    Fear of current or ex partner: Not on file    Emotionally abused: Not on file    Physically abused: Not on file    Forced sexual activity: Not on file  Other Topics Concern  . Not on file  Social History Narrative   ** Merged History Encounter **        Past Surgical History:  Procedure Laterality Date  . ADENOIDECTOMY    . TONSILLECTOMY    . WISDOM TOOTH EXTRACTION      Family History  Problem Relation Age of Onset  . Diabetes Mother   . Hypertension Mother   . Alcohol abuse Mother   . Depression Mother   . Hyperlipidemia Mother   . Alcohol abuse Father   . Depression Father    . Hypertension Father   . Arthritis Maternal Grandmother   . Hyperlipidemia Maternal Grandmother   . Hypertension Maternal Grandmother     Allergies  Allergen Reactions  . Influenza Vaccines     Local reaction to injection site - swelling Overall feeling bad after getting the vaccine  . Zithromax [Azithromycin] Hives    Current Outpatient Medications on File Prior to Visit  Medication Sig Dispense Refill  . albuterol (PROVENTIL HFA;VENTOLIN HFA) 108 (90 Base) MCG/ACT inhaler Inhale 2 puffs into the lungs every 6 (six) hours as needed for wheezing or shortness of breath. 1 Inhaler 0  . HUMIRA PEN 40 MG/0.4ML PNKT Inject 40 mg into the muscle every 14 (fourteen) days.  10  . naloxone (NARCAN) nasal spray 4 mg/0.1 mL Use intranasal for opiate overdose 1 kit 0   No current facility-administered medications on file prior to visit.     BP 124/78   Temp 98.5 F (36.9 C)   Wt 191 lb (86.6 kg)   BMI 25.20 kg/m       Objective:   Physical Exam  Constitutional: He is oriented to person, place, and time. He appears well-developed and well-nourished. No distress.  Eyes: Pupils are equal, round, and reactive to light. Conjunctivae and EOM are normal. Right eye exhibits no discharge. Left eye exhibits no discharge. No scleral icterus.  Neck: Normal range of motion. Neck supple.  Cardiovascular: Normal rate, regular rhythm, normal heart sounds and intact distal pulses. Exam reveals no gallop and no friction rub.  No murmur heard. Pulmonary/Chest: Effort normal and breath sounds normal.  Abdominal: Soft. Bowel sounds are normal. He exhibits no distension and no mass. There is no tenderness. There is no rebound and no guarding. No hernia.  Neurological: He is alert and oriented to person, place, and time. He displays normal reflexes. No cranial nerve deficit or sensory deficit. He exhibits normal muscle tone. Coordination normal.  Skin: Skin is warm and dry. Capillary refill takes less  than 2 seconds. No rash noted. He is not diaphoretic. No erythema. No pallor.  Psychiatric: He has a normal mood and affect. His behavior is normal. Judgment and thought content normal.  Nursing note and vitals reviewed.     Assessment & Plan:  Spoke at length with the patient and his stepmother about drug addiction.  Travez is  adamant about not going to inpatient treatment but is willing to follow-up with his psychiatrist.  From conversation it does not appear as though Rashan thinks he has an issue with drug abuse.  He was advised that he needs to refrain from drugs including alcohol and marijuana.  He was asked about AA and NA, she is tried in the past but did not find it helpful because " the body that goes there is on some type of the drugs or is drinking".  Advised to call family member or friend that he trust if he is ever in the situation again were drugs are being used.  He is free to reach out to me if he wants to discuss anything further.  Will repeat CBC, BMP and drug panel next week as our lab is closed this evening.  Dorothyann Peng, NP

## 2018-03-27 ENCOUNTER — Ambulatory Visit: Payer: BLUE CROSS/BLUE SHIELD | Admitting: Psychiatry

## 2018-03-27 ENCOUNTER — Encounter: Payer: Self-pay | Admitting: Psychiatry

## 2018-03-27 DIAGNOSIS — F1020 Alcohol dependence, uncomplicated: Secondary | ICD-10-CM | POA: Diagnosis not present

## 2018-03-27 DIAGNOSIS — F14221 Cocaine dependence with intoxication delirium: Secondary | ICD-10-CM

## 2018-03-27 DIAGNOSIS — F1120 Opioid dependence, uncomplicated: Secondary | ICD-10-CM

## 2018-03-27 NOTE — Progress Notes (Signed)
Destan Franchini [id number] 1995-07-18 22 y.o.  Subjective:   Patient ID:  Kyle Woodward is a 22 y.o. (DOB 01-04-1996) male.  Chief Complaint:  Chief Complaint  Patient presents with  . Drug Problem    HPI  Seen with mother. Kyle Woodward presents to the office today for follow-up of cocaine, Xanax, and heroine OD ER [facility] November 20.   He's here at mother's and doctors's request. He doesn't think he has any psych problems nor drug problems.  Says between the 2 ER visits with OD of multiple recreational drugs he's been fine and just made bad choices.  Maybe a little anxiety but manageable. Patient reports stable mood and denies depressed or irritable moods.  Patient denies any recent difficulty with anxiety.  Patient denies difficulty with sleep initiation or maintenance. Denies appetite disturbance.  Patient reports that energy and motivation have been good.  Patient denies any difficulty with concentration.  Patient denies any suicidal ideation.  Mother's concerns: drugs.  He had a friend die of drug OD recently.  Denies using drugs alcohol since November.  Just gets bored and does it with friends.  Doesn't crave.  Understands why mother is concerned bc he nearly died twice from drug overdose.  Doesn't think he has a problem.    Prior mandated alcohol treatment was completed after his last DUI.   Review of Systems:  Review of Systems  Neurological: Negative for tremors and weakness.  Psychiatric/Behavioral: Positive for behavioral problems. Negative for agitation, confusion, decreased concentration, dysphoric mood, hallucinations, self-injury, sleep disturbance and suicidal ideas. The patient is nervous/anxious. The patient is not hyperactive.     Medications: I have reviewed the patient's current medications.  Current Outpatient Medications  Medication Sig Dispense Refill  . albuterol (PROVENTIL HFA;VENTOLIN HFA) 108 (90 Base) MCG/ACT inhaler Inhale 2 puffs into the  lungs every 6 (six) hours as needed for wheezing or shortness of breath. 1 Inhaler 0  . HUMIRA PEN 40 MG/0.4ML PNKT Inject 40 mg into the muscle every 14 (fourteen) days.  10  . naloxone (NARCAN) nasal spray 4 mg/0.1 mL Use intranasal for opiate overdose 1 kit 0   No current facility-administered medications for this visit.     Medication Side Effects: None  Allergies:  Allergies  Allergen Reactions  . Influenza Vaccines     Local reaction to injection site - swelling Overall feeling bad after getting the vaccine  . Zithromax [Azithromycin] Hives    Past Medical History:  Diagnosis Date  . Asthma   . Bipolar 1 disorder ([place])   . Depression   . Plaque psoriasis     Family History  Problem Relation Age of Onset  . Diabetes Mother   . Hypertension Mother   . Alcohol abuse Mother   . Depression Mother   . Hyperlipidemia Mother   . Alcohol abuse Father   . Depression Father   . Hypertension Father   . Arthritis Maternal Grandmother   . Hyperlipidemia Maternal Grandmother   . Hypertension Maternal Grandmother     Social History   Socioeconomic History  . Marital status: Single    Spouse name: Not on file  . Number of children: Not on file  . Years of education: Not on file  . Highest education level: Not on file  Occupational History  . Not on file  Social Needs  . Financial resource strain: Not on file  . Food insecurity:    Worry: Not on file  Inability: Not on file  . Transportation needs:    Medical: Not on file    Non-medical: Not on file  Tobacco Use  . Smoking status: Current Every Day Smoker    Types: Cigarettes  . Smokeless tobacco: Never Used  Substance and Sexual Activity  . Alcohol use: Yes    Comment: socially  . Drug use: Yes    Types: Marijuana, Heroin, Oxycodone, Codeine  . Sexual activity: Yes    Partners: Female  Lifestyle  . Physical activity:    Days per week: Not on file    Minutes per session: Not on file  . Stress: Not on  file  Relationships  . Social connections:    Talks on phone: Not on file    Gets together: Not on file    Attends religious service: Not on file    Active member of club or organization: Not on file    Attends meetings of clubs or organizations: Not on file    Relationship status: Not on file  . Intimate partner violence:    Fear of current or ex partner: Not on file    Emotionally abused: Not on file    Physically abused: Not on file    Forced sexual activity: Not on file  Other Topics Concern  . Not on file  Social History Narrative   ** Merged History Encounter **        Past Medical History, Surgical history, Social history, and Family history were reviewed and updated as appropriate.   Please see review of systems for further details on the patient's review from today.   Objective:   Physical Exam:  There were no vitals taken for this visit.  Physical Exam  Constitutional: He is oriented to person, place, and time. He appears well-developed. No distress.  Musculoskeletal: He exhibits no deformity.  Neurological: He is alert and oriented to person, place, and time. He displays no tremor. Coordination and gait normal.  Psychiatric: He has a normal mood and affect. His speech is normal and behavior is normal. Thought content normal. His mood appears not anxious. His affect is not angry, not blunt, not labile and not inappropriate. Cognition and memory are normal. He does not exhibit a depressed mood. He expresses no homicidal and no suicidal ideation. He expresses no suicidal plans and no homicidal plans.  Poor insight and judgment.  Heavy denial re: drug problem. No auditory or visual hallucinations. No delusions.     Lab Review:     Component Value Date/Time   NA 135 03/12/2018 0433   K 3.2 (L) 03/12/2018 0433   CL 102 03/12/2018 0433   CO2 18 (L) 03/12/2018 0433   GLUCOSE 282 (H) 03/12/2018 0433   BUN 7 03/12/2018 0433   CREATININE 1.23 03/12/2018 0433   CALCIUM  8.8 (L) 03/12/2018 0433   PROT 7.5 03/12/2018 0433   ALBUMIN 4.5 03/12/2018 0433   AST 21 03/12/2018 0433   ALT 18 03/12/2018 0433   ALKPHOS 66 03/12/2018 0433   BILITOT 0.4 03/12/2018 0433   GFRNONAA >60 03/12/2018 0433   GFRAA >60 03/12/2018 0433       Component Value Date/Time   WBC 14.0 (H) 03/12/2018 0433   RBC 5.73 03/12/2018 0433   HGB 15.7 03/12/2018 0433   HCT 51.2 03/12/2018 0433   PLT 328 03/12/2018 0433   MCV 89.4 03/12/2018 0433   MCH 27.4 03/12/2018 0433   MCHC 30.7 03/12/2018 0433   RDW 13.6 03/12/2018 0433  LYMPHSABS 5.0 (H) 03/12/2018 0433   MONOABS 1.1 (H) 03/12/2018 0433   EOSABS 0.1 03/12/2018 0433   BASOSABS 0.1 03/12/2018 0433    No results found for: POCLITH, LITHIUM   No results found for: PHENYTOIN, PHENOBARB, VALPROATE, CBMZ   .res Assessment: Plan:    Opiate dependence, continuous (HCC)  Cocaine dependence with intoxication delirium ([place])  Uncomplicated alcohol dependence ([place])  Interventional, motivational interviewing over his drug dependence and the risk of death from this behavior.  Mother in for much of this interview.  If I'm around bad people I do bad things.    Disc relative risks of the various drugs of abuse.  Educated about the risks of death from [place].  Knows that if he had been alone with the last OD that he'd be dead.  Is not suicidal.  Refuses other treatment options.    Option NAC may help craving a little.  Rec return to previous counselor and he agrees.  Option addiction specialist.  40 min appt.  FU prn  Lynder Parents, MD, DFAPA    Please see After Visit Summary for patient specific instructions.  Future Appointments  Date Time Provider [place]  05/06/2018  3:30 PM Cottle, Billey [place]., MD CP-CP None    No orders of the defined types were placed in this encounter.     -------------------------------

## 2018-03-27 NOTE — Patient Instructions (Signed)
Option NAC 600-1200mg  daily may help some.

## 2018-04-07 ENCOUNTER — Other Ambulatory Visit (INDEPENDENT_AMBULATORY_CARE_PROVIDER_SITE_OTHER): Payer: BLUE CROSS/BLUE SHIELD

## 2018-04-07 DIAGNOSIS — T50901D Poisoning by unspecified drugs, medicaments and biological substances, accidental (unintentional), subsequent encounter: Secondary | ICD-10-CM

## 2018-04-07 DIAGNOSIS — F1910 Other psychoactive substance abuse, uncomplicated: Secondary | ICD-10-CM

## 2018-04-07 LAB — BASIC METABOLIC PANEL
BUN: 17 mg/dL (ref 6–23)
CALCIUM: 9.4 mg/dL (ref 8.4–10.5)
CO2: 26 meq/L (ref 19–32)
Chloride: 103 mEq/L (ref 96–112)
Creatinine, Ser: 0.91 mg/dL (ref 0.40–1.50)
GFR: 112.55 mL/min (ref >60.00)
Glucose, Bld: 124 mg/dL — ABNORMAL HIGH (ref 70–99)
POTASSIUM: 4.2 meq/L (ref 3.5–5.1)
Sodium: 137 mEq/L (ref 135–145)

## 2018-04-07 LAB — CBC WITH DIFFERENTIAL/PLATELET
Basophils Absolute: 0 10*3/uL (ref 0.0–0.1)
Basophils Relative: 0.8 % (ref 0.0–3.0)
EOS ABS: 0.2 10*3/uL (ref 0.0–0.7)
EOS PCT: 5.4 % — AB (ref 0.0–5.0)
HCT: 44.7 % (ref 39.0–52.0)
HEMOGLOBIN: 14.9 g/dL (ref 13.0–17.0)
LYMPHS PCT: 28.4 % (ref 12.0–46.0)
Lymphs Abs: 1.3 10*3/uL (ref 0.7–4.0)
MCHC: 33.3 g/dL (ref 30.0–36.0)
MCV: 85.8 fl (ref 78.0–100.0)
Monocytes Absolute: 0.4 10*3/uL (ref 0.1–1.0)
Monocytes Relative: 8.8 % (ref 3.0–12.0)
NEUTROS ABS: 2.6 10*3/uL (ref 1.4–7.7)
NEUTROS PCT: 56.6 % (ref 43.0–77.0)
Platelets: 212 10*3/uL (ref 150.0–400.0)
RBC: 5.21 Mil/uL (ref 4.22–5.81)
RDW: 13.1 % (ref 11.5–14.6)
WBC: 4.5 10*3/uL (ref 4.5–10.5)

## 2018-04-08 ENCOUNTER — Encounter: Payer: Self-pay | Admitting: Adult Health

## 2018-04-10 LAB — PAIN MGMT, PROFILE 8 W/CONF, U
6 Acetylmorphine: NEGATIVE ng/mL (ref <10)
Alcohol Metabolites: POSITIVE ng/mL — AB (ref <500)
Amphetamines: NEGATIVE ng/mL (ref <500)
Benzodiazepines: NEGATIVE ng/mL (ref <100)
Benzoylecgonine: 5131 ng/mL — ABNORMAL HIGH (ref <100)
Buprenorphine, Urine: NEGATIVE ng/mL (ref <5)
Cocaine Metabolite: POSITIVE ng/mL — AB (ref <150)
Codeine: NEGATIVE ng/mL (ref <50)
Creatinine: 129.9 mg/dL
Ethyl Glucuronide (ETG): 23188 ng/mL — ABNORMAL HIGH (ref <500)
Ethyl Sulfate (ETS): 2683 ng/mL — ABNORMAL HIGH (ref <100)
Hydrocodone: NEGATIVE ng/mL (ref <50)
Hydromorphone: NEGATIVE ng/mL (ref <50)
MDMA: NEGATIVE ng/mL (ref <500)
Marijuana Metabolite: >500 ng/mL — ABNORMAL HIGH (ref <5)
Marijuana Metabolite: POSITIVE ng/mL — AB (ref <20)
Morphine: 453 ng/mL — ABNORMAL HIGH (ref <50)
Norhydrocodone: NEGATIVE ng/mL (ref <50)
Opiates: POSITIVE ng/mL — AB (ref <100)
Oxidant: NEGATIVE ug/mL (ref <200)
Oxycodone: NEGATIVE ng/mL (ref <100)
pH: 5.74 (ref 4.5–9.0)

## 2018-04-15 NOTE — Telephone Encounter (Signed)
Spoke to patients DPR and informed her drug test.

## 2018-04-16 ENCOUNTER — Encounter: Payer: Self-pay | Admitting: Emergency Medicine

## 2018-04-16 DIAGNOSIS — F319 Bipolar disorder, unspecified: Secondary | ICD-10-CM | POA: Insufficient documentation

## 2018-04-16 DIAGNOSIS — F411 Generalized anxiety disorder: Secondary | ICD-10-CM

## 2018-05-06 ENCOUNTER — Ambulatory Visit: Payer: Self-pay | Admitting: Psychiatry
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
## 2018-09-18 ENCOUNTER — Encounter: Payer: Self-pay | Admitting: Adult Health

## 2018-12-23 ENCOUNTER — Encounter: Payer: Self-pay | Admitting: Adult Health

## 2018-12-23 NOTE — Telephone Encounter (Signed)
Pt will have test done at pharmacy.  Nothing further needed.

## 2019-01-27 ENCOUNTER — Other Ambulatory Visit: Payer: Self-pay | Admitting: Adult Health

## 2019-01-27 DIAGNOSIS — J4520 Mild intermittent asthma, uncomplicated: Secondary | ICD-10-CM

## 2019-02-03 ENCOUNTER — Ambulatory Visit: Payer: BLUE CROSS/BLUE SHIELD | Admitting: Psychiatry

## 2019-04-29 ENCOUNTER — Ambulatory Visit: Payer: BLUE CROSS/BLUE SHIELD | Admitting: Psychiatry

## 2019-05-08 ENCOUNTER — Other Ambulatory Visit: Payer: BLUE CROSS/BLUE SHIELD

## 2019-12-02 ENCOUNTER — Encounter: Payer: Self-pay | Admitting: Adult Health

## 2019-12-02 ENCOUNTER — Ambulatory Visit (INDEPENDENT_AMBULATORY_CARE_PROVIDER_SITE_OTHER): Payer: BLUE CROSS/BLUE SHIELD | Admitting: Adult Health

## 2019-12-02 ENCOUNTER — Other Ambulatory Visit: Payer: Self-pay

## 2019-12-02 ENCOUNTER — Inpatient Hospital Stay (HOSPITAL_COMMUNITY): Admit: 2019-12-02 | Payer: BLUE CROSS/BLUE SHIELD

## 2019-12-02 VITALS — BP 110/78 | Ht 73.0 in | Wt 171.0 lb

## 2019-12-02 DIAGNOSIS — Z0000 Encounter for general adult medical examination without abnormal findings: Secondary | ICD-10-CM | POA: Diagnosis not present

## 2019-12-02 DIAGNOSIS — L409 Psoriasis, unspecified: Secondary | ICD-10-CM

## 2019-12-02 DIAGNOSIS — Z113 Encounter for screening for infections with a predominantly sexual mode of transmission: Secondary | ICD-10-CM | POA: Diagnosis not present

## 2019-12-02 DIAGNOSIS — L700 Acne vulgaris: Secondary | ICD-10-CM | POA: Diagnosis not present

## 2019-12-02 DIAGNOSIS — Z1159 Encounter for screening for other viral diseases: Secondary | ICD-10-CM

## 2019-12-02 DIAGNOSIS — F1910 Other psychoactive substance abuse, uncomplicated: Secondary | ICD-10-CM

## 2019-12-02 NOTE — Progress Notes (Signed)
Subjective:    Patient ID: Kyle Woodward, male    DOB: 1995-09-01, 24 y.o.   MRN: [id number]  HPI Patient presents for yearly preventative medicine examination. He is a pleasant 24 year old male who  has a past medical history of Asthma, Bipolar 1 disorder ([place]), Depression, and Plaque psoriasis.  Psoriasis -he is prescribed Humira by dermatology.  He has good adherence and clearance with this medication  Acne-doxycycline, prescribed by dermatology  Polysubstance Abuse - has been sober for a year a half.   All immunizations and health maintenance protocols were reviewed with the patient and needed orders were placed.  Appropriate screening laboratory values were ordered for the patient including screening of hyperlipidemia, renal function and hepatic function.  Medication reconciliation,  past medical history, social history, problem list and allergies were reviewed in detail with the patient  Goals were established with regard to weight loss, exercise, and  diet in compliance with medications. He is working out often at [employer], and is physically active at work. He eats healthy    Review of Systems  Constitutional: Negative.   HENT: Negative.   Eyes: Negative.   Respiratory: Negative.   Cardiovascular: Negative.   Gastrointestinal: Negative.   Endocrine: Negative.   Genitourinary: Negative.   Musculoskeletal: Negative.   Skin: Negative.   Allergic/Immunologic: Negative.   Neurological: Negative.   Hematological: Negative.   Psychiatric/Behavioral: Negative.   All other systems reviewed and are negative.  Past Medical History:  Diagnosis Date  . Asthma   . Bipolar 1 disorder ([place])   . Depression   . Plaque psoriasis     Social History   Socioeconomic History  . Marital status: Single    Spouse name: Not on file  . Number of children: Not on file  . Years of education: Not on file  . Highest education level: Not on file  Occupational History  . Not on file   Tobacco Use  . Smoking status: Current Every Day Smoker    Types: Cigarettes  . Smokeless tobacco: Never Used  Vaping Use  . Vaping Use: Every day  Substance and Sexual Activity  . Alcohol use: Yes    Comment: socially  . Drug use: Yes    Types: Marijuana, Heroin, Oxycodone, Codeine  . Sexual activity: Yes    Partners: Female  Other Topics Concern  . Not on file  Social History Narrative   ** Merged History Encounter **       Social Determinants of Health   Financial Resource Strain:   . Difficulty of Paying Living Expenses:   Food Insecurity:   . Worried About Charity fundraiser in the Last Year:   . Arboriculturist in the Last Year:   Transportation Needs:   . Film/video editor (Medical):   Marland Kitchen Lack of Transportation (Non-Medical):   Physical Activity:   . Days of Exercise per Week:   . Minutes of Exercise per Session:   Stress:   . Feeling of Stress :   Social Connections:   . Frequency of Communication with Friends and Family:   . Frequency of Social Gatherings with Friends and Family:   . Attends Religious Services:   . Active Member of Clubs or Organizations:   . Attends Archivist Meetings:   Marland Kitchen Marital Status:   Intimate Partner Violence:   . Fear of Current or Ex-Partner:   . Emotionally Abused:   Marland Kitchen Physically Abused:   .  Sexually Abused:     Past Surgical History:  Procedure Laterality Date  . ADENOIDECTOMY    . TONSILLECTOMY    . WISDOM TOOTH EXTRACTION      Family History  Problem Relation Age of Onset  . Diabetes Mother   . Hypertension Mother   . Alcohol abuse Mother   . Depression Mother   . Hyperlipidemia Mother   . Alcohol abuse Father   . Depression Father   . Hypertension Father   . Arthritis Maternal Grandmother   . Hyperlipidemia Maternal Grandmother   . Hypertension Maternal Grandmother     Allergies  Allergen Reactions  . Influenza Vaccines     Local reaction to injection site - swelling Overall feeling  bad after getting the vaccine  . Zithromax [Azithromycin] Hives    Current Outpatient Medications on File Prior to Visit  Medication Sig Dispense Refill  . divalproex (DEPAKOTE ER) 500 MG 24 hr tablet Take 500 mg by mouth daily.    Marland Kitchen HUMIRA PEN 40 MG/0.4ML PNKT Inject 40 mg into the muscle every 14 (fourteen) days.  10  . hydrOXYzine (ATARAX/VISTARIL) 25 MG tablet Take 25 mg by mouth daily.    . naloxone (NARCAN) nasal spray 4 mg/0.1 mL Use intranasal for opiate overdose 1 kit 0  . PROAIR HFA 108 (90 Base) MCG/ACT inhaler TAKE 2 PUFFS BY MOUTH EVERY 6 HOURS AS NEEDED FOR WHEEZE OR SHORTNESS OF BREATH 8 g 0  . sertraline (ZOLOFT) 25 MG tablet Take 25 mg by mouth daily.     No current facility-administered medications on file prior to visit.    There were no vitals taken for this visit.      Objective:   Physical Exam Vitals and nursing note reviewed.  Constitutional:      General: He is not in acute distress.    Appearance: Normal appearance. He is well-developed and normal weight.  HENT:     Head: Normocephalic and atraumatic.     Right Ear: Tympanic membrane, ear canal and external ear normal. There is no impacted cerumen.     Left Ear: Tympanic membrane, ear canal and external ear normal. There is no impacted cerumen.     Nose: Nose normal. No congestion or rhinorrhea.     Mouth/Throat:     Mouth: Mucous membranes are moist.     Pharynx: Oropharynx is clear. No oropharyngeal exudate or posterior oropharyngeal erythema.  Eyes:     General:        Right eye: No discharge.        Left eye: No discharge.     Extraocular Movements: Extraocular movements intact.     Conjunctiva/sclera: Conjunctivae normal.     Pupils: Pupils are equal, round, and reactive to light.  Neck:     Vascular: No carotid bruit.     Trachea: No tracheal deviation.  Cardiovascular:     Rate and Rhythm: Normal rate and regular rhythm.     Pulses: Normal pulses.     Heart sounds: Normal heart sounds.  No murmur heard.  No friction rub. No gallop.   Pulmonary:     Effort: Pulmonary effort is normal. No respiratory distress.     Breath sounds: Normal breath sounds. No stridor. No wheezing, rhonchi or rales.  Chest:     Chest wall: No tenderness.  Abdominal:     General: Bowel sounds are normal. There is no distension.     Palpations: Abdomen is soft. There is no mass.  Tenderness: There is no abdominal tenderness. There is no right CVA tenderness, left CVA tenderness, guarding or rebound.     Hernia: No hernia is present.  Musculoskeletal:        General: No swelling, tenderness, deformity or signs of injury. Normal range of motion.     Right lower leg: No edema.     Left lower leg: No edema.  Lymphadenopathy:     Cervical: No cervical adenopathy.  Skin:    General: Skin is warm and dry.     Capillary Refill: Capillary refill takes less than 2 seconds.     Coloration: Skin is not jaundiced or pale.     Findings: Bruising (lower extremities) present. No erythema, lesion or rash.  Neurological:     General: No focal deficit present.     Mental Status: He is alert and oriented to person, place, and time.     Cranial Nerves: No cranial nerve deficit.     Sensory: No sensory deficit.     Motor: No weakness.     Coordination: Coordination normal.     Gait: Gait normal.     Deep Tendon Reflexes: Reflexes normal.  Psychiatric:        Mood and Affect: Mood normal.        Behavior: Behavior normal.        Thought Content: Thought content normal.        Judgment: Judgment normal.       Assessment & Plan:  1. Routine general medical examination at a health care facility - Follow up in one year or sooner if needed - Continue to stay active and eat healthy  - CBC with Differential/Platelet; Future - Lipid panel; Future - TSH; Future - CMP with eGFR([facility]); Future - Iron, TIBC and Ferritin Panel; Future  2. Psoriasis - Follow up with dermatology as directed - CBC with  Differential/Platelet; Future - Lipid panel; Future - TSH; Future - CMP with eGFR([facility]); Future  3. Acne vulgaris - Follow up with dermatology as directed - CBC with Differential/Platelet; Future - Lipid panel; Future - TSH; Future - CMP with eGFR([facility]); Future  4. Screen for STD (sexually transmitted disease)  - Acute Hep Panel & Hep B Surface Ab; Future - HIV Antibody (routine testing w rflx); Future - RPR; Future - Hep C Antibody; Future - Urine cytology ancillary only; Future  5. Need for hepatitis C screening test  - Hep C Antibody; Future  6. Polysubstance abuse ([place]) - Congratulated on sobriety.  - Acute Hep Panel & Hep B Surface Ab; Future - Hep C Antibody; Future  Dorothyann Peng, NP

## 2019-12-02 NOTE — Addendum Note (Signed)
Addended by: Lerry Liner on: 12/02/2019 07:45 AM   Modules accepted: Orders

## 2019-12-03 LAB — CBC WITH DIFFERENTIAL/PLATELET
Absolute Monocytes: 472 cells/uL (ref 200–950)
Basophils Absolute: 32 cells/uL (ref 0–200)
Basophils Relative: 0.8 %
Eosinophils Absolute: 120 cells/uL (ref 15–500)
Eosinophils Relative: 3 %
HCT: 44.7 % (ref 38.5–50.0)
Hemoglobin: 14.8 g/dL (ref 13.2–17.1)
Lymphs Abs: 952 cells/uL (ref 850–3900)
MCH: 27.8 pg (ref 27.0–33.0)
MCHC: 33.1 g/dL (ref 32.0–36.0)
MCV: 83.9 fL (ref 80.0–100.0)
MPV: 12.4 fL (ref 7.5–12.5)
Monocytes Relative: 11.8 %
Neutro Abs: 2424 cells/uL (ref 1500–7800)
Neutrophils Relative %: 60.6 %
Platelets: 223 10*3/uL (ref 140–400)
RBC: 5.33 10*6/uL (ref 4.20–5.80)
RDW: 12.4 % (ref 11.0–15.0)
Total Lymphocyte: 23.8 %
WBC: 4 10*3/uL (ref 3.8–10.8)

## 2019-12-03 LAB — COMPLETE METABOLIC PANEL WITH GFR
AG Ratio: 2 (calc) (ref 1.0–2.5)
ALT: 40 U/L (ref 9–46)
AST: 25 U/L (ref 10–40)
Albumin: 4.5 g/dL (ref 3.6–5.1)
Alkaline phosphatase (APISO): 87 U/L (ref 36–130)
BUN: 16 mg/dL (ref 7–25)
CO2: 26 mmol/L (ref 20–32)
Calcium: 9.6 mg/dL (ref 8.6–10.3)
Chloride: 104 mmol/L (ref 98–110)
Creat: 0.9 mg/dL (ref 0.60–1.35)
GFR, Est African American: 141 mL/min/{1.73_m2} (ref >=60)
GFR, Est Non African American: 122 mL/min/{1.73_m2} (ref >=60)
Globulin: 2.3 g/dL (calc) (ref 1.9–3.7)
Glucose, Bld: 84 mg/dL (ref 65–99)
Potassium: 4.5 mmol/L (ref 3.5–5.3)
Sodium: 140 mmol/L (ref 135–146)
Total Bilirubin: 0.6 mg/dL (ref 0.2–1.2)
Total Protein: 6.8 g/dL (ref 6.1–8.1)

## 2019-12-03 LAB — LIPID PANEL
Cholesterol: 164 mg/dL (ref <200)
HDL: 64 mg/dL (ref >=40)
LDL Cholesterol (Calc): 87 mg/dL (calc)
Non-HDL Cholesterol (Calc): 100 mg/dL (calc) (ref <130)
Total CHOL/HDL Ratio: 2.6 (calc) (ref <5.0)
Triglycerides: 43 mg/dL (ref <150)

## 2019-12-03 LAB — ACUTE HEP PANEL AND HEP B SURFACE AB
HEPATITIS C ANTIBODY REFILL$(REFL): NONREACTIVE
Hep A IgM: NONREACTIVE
Hep B C IgM: NONREACTIVE
Hepatitis B Surface Ag: NONREACTIVE
SIGNAL TO CUT-OFF: 0.01 (ref <1.00)

## 2019-12-03 LAB — IRON,TIBC AND FERRITIN PANEL
%SAT: 32 % (calc) (ref 20–48)
Ferritin: 57 ng/mL (ref 38–380)
Iron: 95 ug/dL (ref 50–195)
TIBC: 297 mcg/dL (calc) (ref 250–425)

## 2019-12-03 LAB — TSH: TSH: 1.12 mIU/L (ref 0.40–4.50)

## 2019-12-03 LAB — URINE CYTOLOGY ANCILLARY ONLY
Chlamydia: NEGATIVE
Comment: NEGATIVE
Comment: NEGATIVE
Comment: NORMAL
Neisseria Gonorrhea: NEGATIVE
Trichomonas: NEGATIVE

## 2019-12-03 LAB — HEPATITIS C ANTIBODY
Hepatitis C Ab: NONREACTIVE
SIGNAL TO CUT-OFF: 0.01 (ref <1.00)

## 2019-12-03 LAB — REFLEX TIQ

## 2019-12-03 LAB — HIV ANTIBODY (ROUTINE TESTING W REFLEX): HIV 1&2 Ab, 4th Generation: NONREACTIVE

## 2019-12-03 LAB — RPR: RPR Ser Ql: NONREACTIVE

## 2020-09-22 ENCOUNTER — Emergency Department (HOSPITAL_BASED_OUTPATIENT_CLINIC_OR_DEPARTMENT_OTHER)
Admission: EM | Admit: 2020-09-22 | Discharge: 2020-09-22 | Disposition: A | Discharge status: Home / Self Care | Payer: BC Managed Care – PPO | Attending: Emergency Medicine | Admitting: Emergency Medicine

## 2020-09-22 ENCOUNTER — Encounter (HOSPITAL_BASED_OUTPATIENT_CLINIC_OR_DEPARTMENT_OTHER): Payer: Self-pay | Admitting: Emergency Medicine

## 2020-09-22 ENCOUNTER — Emergency Department (HOSPITAL_BASED_OUTPATIENT_CLINIC_OR_DEPARTMENT_OTHER): Payer: BC Managed Care – PPO

## 2020-09-22 ENCOUNTER — Other Ambulatory Visit: Payer: Self-pay

## 2020-09-22 DIAGNOSIS — Y9239 Other specified sports and athletic area as the place of occurrence of the external cause: Secondary | ICD-10-CM | POA: Diagnosis not present

## 2020-09-22 DIAGNOSIS — S6992XA Unspecified injury of left wrist, hand and finger(s), initial encounter: Secondary | ICD-10-CM | POA: Diagnosis present

## 2020-09-22 DIAGNOSIS — S6982XA Other specified injuries of left wrist, hand and finger(s), initial encounter: Secondary | ICD-10-CM

## 2020-09-22 DIAGNOSIS — Z87891 Personal history of nicotine dependence: Secondary | ICD-10-CM | POA: Diagnosis not present

## 2020-09-22 DIAGNOSIS — S63592A Other specified sprain of left wrist, initial encounter: Secondary | ICD-10-CM | POA: Diagnosis not present

## 2020-09-22 DIAGNOSIS — X500XXA Overexertion from strenuous movement or load, initial encounter: Secondary | ICD-10-CM | POA: Diagnosis not present

## 2020-09-22 DIAGNOSIS — Y9372 Activity, wrestling: Secondary | ICD-10-CM | POA: Diagnosis not present

## 2020-09-22 DIAGNOSIS — J45909 Unspecified asthma, uncomplicated: Secondary | ICD-10-CM | POA: Diagnosis not present

## 2020-09-22 NOTE — ED Notes (Signed)
Brace applied to left wrist, good cap refills and mobility with fingers.

## 2020-09-22 NOTE — ED Triage Notes (Signed)
Pt via pov from home with wrist injury. Pt states he hurt it arm wrestling a few months ago, it got better and he feels like he reinjured it in the gym Monday. Pt reports pain in his left wrist, especially when moving it side to side. Pt alert & oriented, nad noted.

## 2020-09-22 NOTE — ED Provider Notes (Signed)
[facility] EMERGENCY DEPT Provider Note   CSN: [id number] Arrival date & time: 09/22/20  0800     History Chief Complaint  Patient presents with  . Wrist Pain    Kyle Woodward is a 25 y.o. male.  The initial injury occurred when he was arm wrestling another person.  He had a forceful hyperextension injury and developed ulnar pain.  He is left-handed and works in [employer].  He states that his symptoms lasted for a while but were mostly manageable.  3 days ago, he was racking weights when he developed a recurrence of his pain.  The history is provided by the patient.  Wrist Pain This is a recurrent problem. The current episode started more than 1 week ago (3 months ago; exacerbated 3 days ago). The problem occurs constantly. The problem has not changed since onset.Pertinent negatives include no chest pain, no abdominal pain, no headaches and no shortness of breath. The symptoms are aggravated by twisting (ulnar deviation and supination). The symptoms are relieved by rest. Treatments tried: wrist wraps. The treatment provided mild relief.       Past Medical History:  Diagnosis Date  . Asthma   . Bipolar 1 disorder ([facility])   . Depression   . Plaque psoriasis     Patient Active Problem List   Diagnosis Date Noted  . GAD (generalized anxiety disorder) 04/16/2018  . Bipolar disorder ([facility]) 04/16/2018  . Psoriasis 05/06/2017    Past Surgical History:  Procedure Laterality Date  . ADENOIDECTOMY    . TONSILLECTOMY    . WISDOM TOOTH EXTRACTION         Family History  Problem Relation Age of Onset  . Diabetes Mother   . Hypertension Mother   . Alcohol abuse Mother   . Depression Mother   . Hyperlipidemia Mother   . Alcohol abuse Father   . Depression Father   . Hypertension Father   . Arthritis Maternal Grandmother   . Hyperlipidemia Maternal Grandmother   . Hypertension Maternal Grandmother     Social History   Tobacco Use  . Smoking status:  Former Smoker    Types: Cigarettes    Quit date: 04/06/2018    Years since quitting: 2.4  . Smokeless tobacco: Never Used  Vaping Use  . Vaping Use: Every day  Substance Use Topics  . Alcohol use: Yes    Comment: socially  . Drug use: Yes    Types: Marijuana, Heroin, Oxycodone, Codeine    Comment: occasionally    Home Medications Prior to Admission medications   Medication Sig Start Date End Date Taking? Authorizing Provider  HUMIRA PEN 40 MG/0.4ML PNKT Inject 40 mg into the muscle every 14 (fourteen) days. 02/04/18   [provider]  naloxone [employer]) nasal spray 4 mg/0.1 mL Use intranasal for opiate overdose 03/12/18   Zadie Rhine, MD  PROAIR HFA 108 [phone] Base) MCG/ACT inhaler TAKE 2 PUFFS BY MOUTH EVERY 6 HOURS AS NEEDED FOR WHEEZE OR SHORTNESS OF BREATH Patient not taking: Reported on 12/02/2019 01/28/19   Shirline Frees, NP    Allergies    Influenza vaccines and Zithromax [azithromycin]  Review of Systems   Review of Systems  Constitutional: Negative for chills and fever.  HENT: Negative for ear pain and sore throat.   Eyes: Negative for pain and visual disturbance.  Respiratory: Negative for cough and shortness of breath.   Cardiovascular: Negative for chest pain and palpitations.  Gastrointestinal: Negative for abdominal pain and vomiting.  Genitourinary: Negative for dysuria and hematuria.  Musculoskeletal: Negative for arthralgias and back pain.  Skin: Negative for color change and rash.  Neurological: Negative for seizures, syncope and headaches.  All other systems reviewed and are negative.   Physical Exam Updated Vital Signs BP 131/74 (BP Location: Right Arm)   Pulse (!) 55   Temp 97.8 F (36.6 C) (Oral)   Resp 18   Ht 6\' 2"  (1.88 m)   Wt 81.6 kg   SpO2 100%   BMI 23.11 kg/m   Physical Exam Vitals and nursing note reviewed.  Constitutional:      Appearance: Normal appearance.  HENT:     Head: Normocephalic and atraumatic.  Eyes:      Conjunctiva/sclera: Conjunctivae normal.  Pulmonary:     Effort: Pulmonary effort is normal. No respiratory distress.  Musculoskeletal:        General: No deformity. Normal range of motion.     Cervical back: Normal range of motion.     Comments: The left wrist is normal to inspection.  He has mild tenderness to palpation at the ulnar and volar aspect of the wrist over the TFCC.  Forced ulnar deviation recreates his pain.  No tendon snapping.  No instability at the distal radial ulnar joint.  Skin:    General: Skin is warm and dry.     Capillary Refill: Capillary refill takes less than 2 seconds.  Neurological:     General: No focal deficit present.     Mental Status: He is alert and oriented to person, place, and time. Mental status is at baseline.  Psychiatric:        Mood and Affect: Mood normal.     ED Results / Procedures / Treatments   Labs (all labs ordered are listed, but only abnormal results are displayed) Labs Reviewed - No data to display  EKG None  Radiology DG Wrist Complete Left  Result Date: 09/22/2020 CLINICAL DATA:  Left wrist pain after injury. EXAM: LEFT WRIST - COMPLETE 3+ VIEW COMPARISON:  None. FINDINGS: There is no evidence of fracture or dislocation. There is no evidence of arthropathy or other focal bone abnormality. Soft tissues are unremarkable. IMPRESSION: Negative. Electronically Signed   By: 11/22/2020 M.D.   On: 09/22/2020 08:46    Procedures Procedures   Medications Ordered in ED Medications - No data to display  ED Course  I have reviewed the triage vital signs and the nursing notes.  Pertinent labs & imaging results that were available during my care of the patient were reviewed by me and considered in my medical decision making (see chart for details).    MDM Rules/Calculators/A&P                          [name] presents with left wrist pain that is acute on chronic.  I think he has likely injured his TFCC.  I have  advised him to use a wrist splint for comfort and to seek follow-up care with an orthopedic surgeon who specializes in hand injuries.  Alternatively, I did consider whether or not he had tenosynovitis, inflammatory arthropathy.  These conditions seem much less likely. Final Clinical Impression(s) / ED Diagnoses Final diagnoses:  Injury of triangular fibrocartilage complex (TFCC) of left wrist, initial encounter    Rx / DC Orders ED Discharge Orders    None       Neva Seat, MD 09/22/20 [phone]

## 2022-01-03 IMAGING — DX DG WRIST COMPLETE 3+V*L*
1 series · 4 of 4 positions shown · non-contrast
Comparison: None.

CLINICAL DATA: Left wrist pain after injury.

EXAM:
LEFT WRIST - COMPLETE 3+ VIEW

[Series 1: wrist · 0.14mm/px · 4 of 4 slices shown]
[im 1/4]
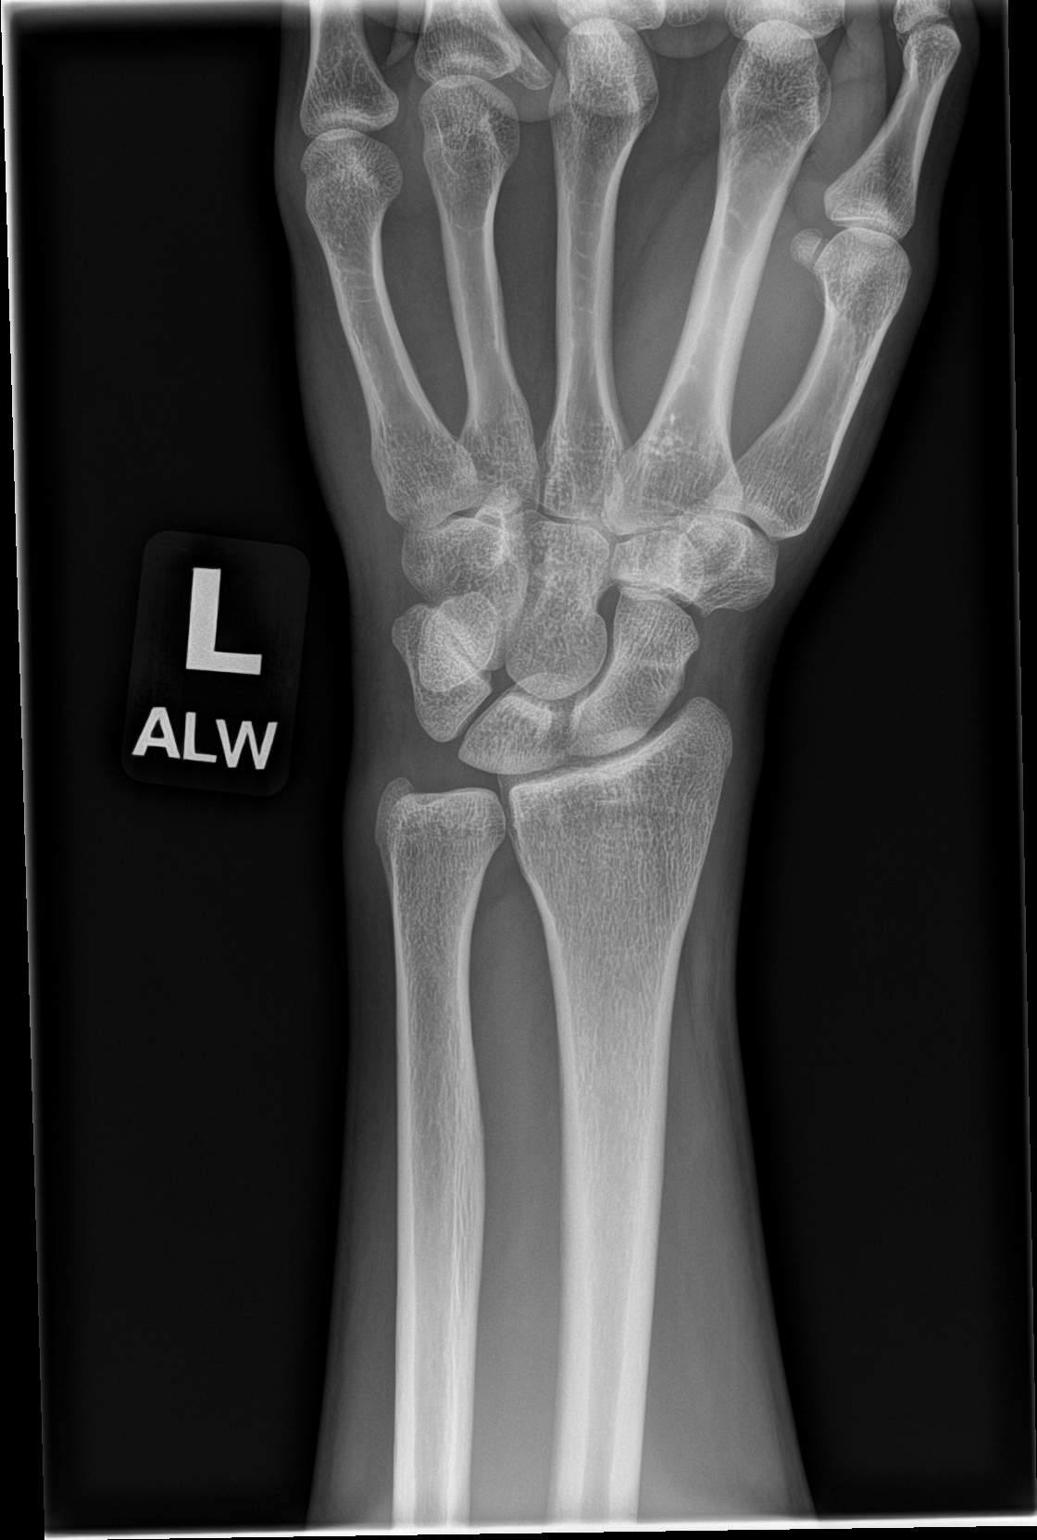
[im 2/4]
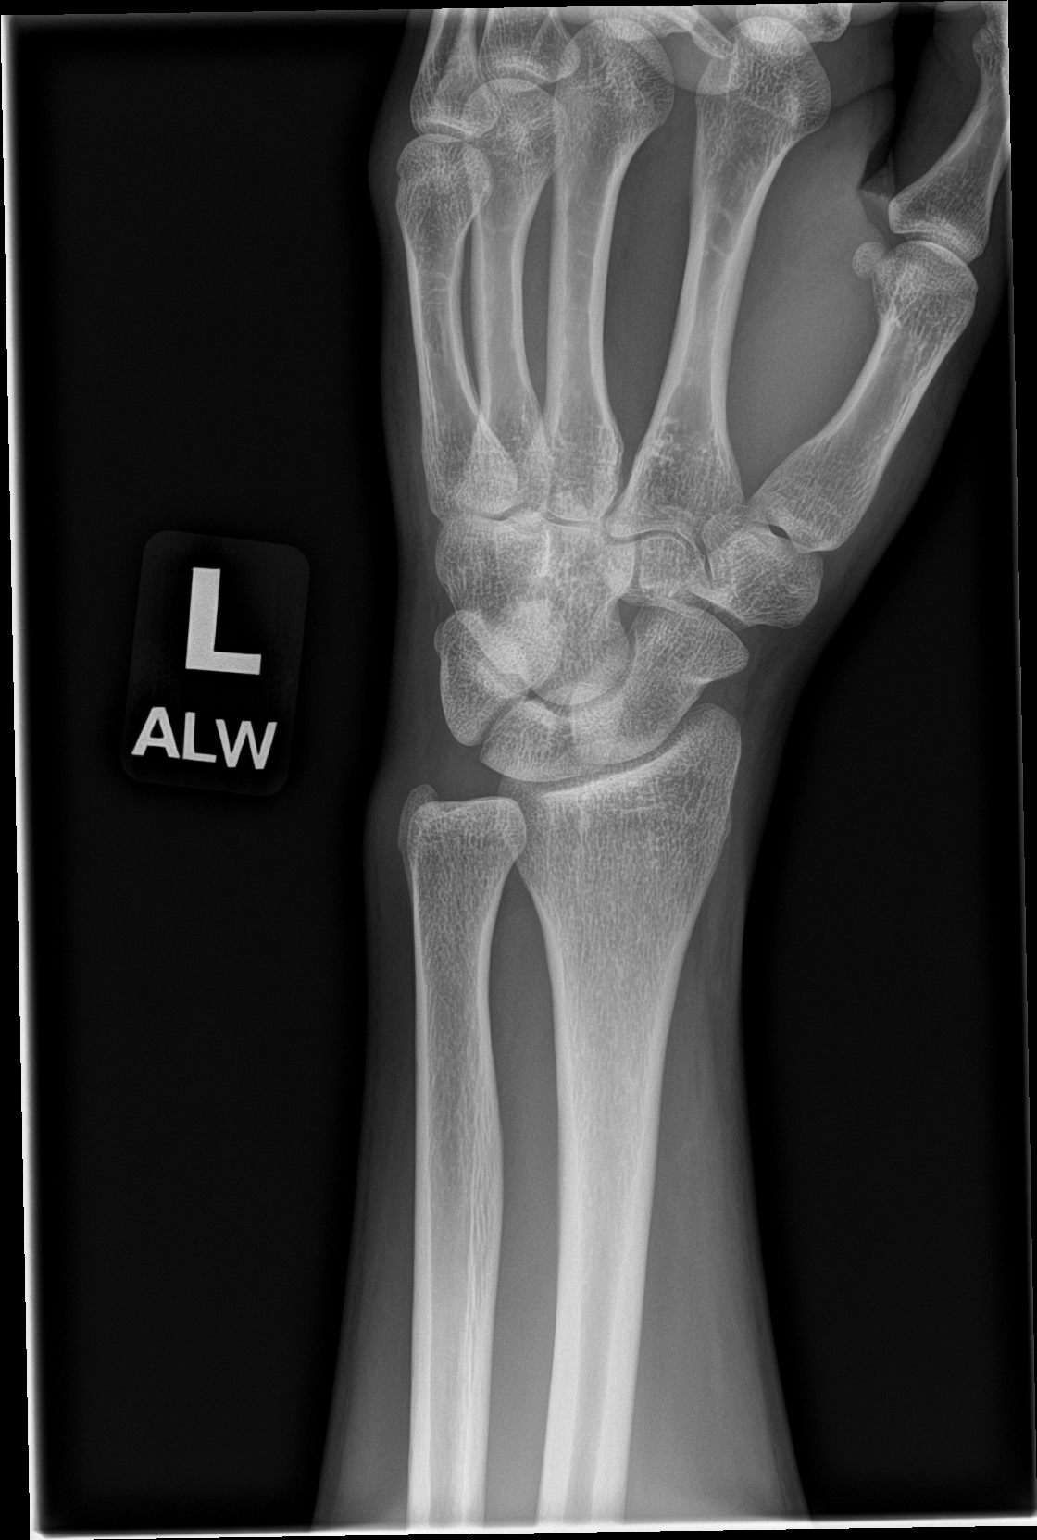
[im 3/4]
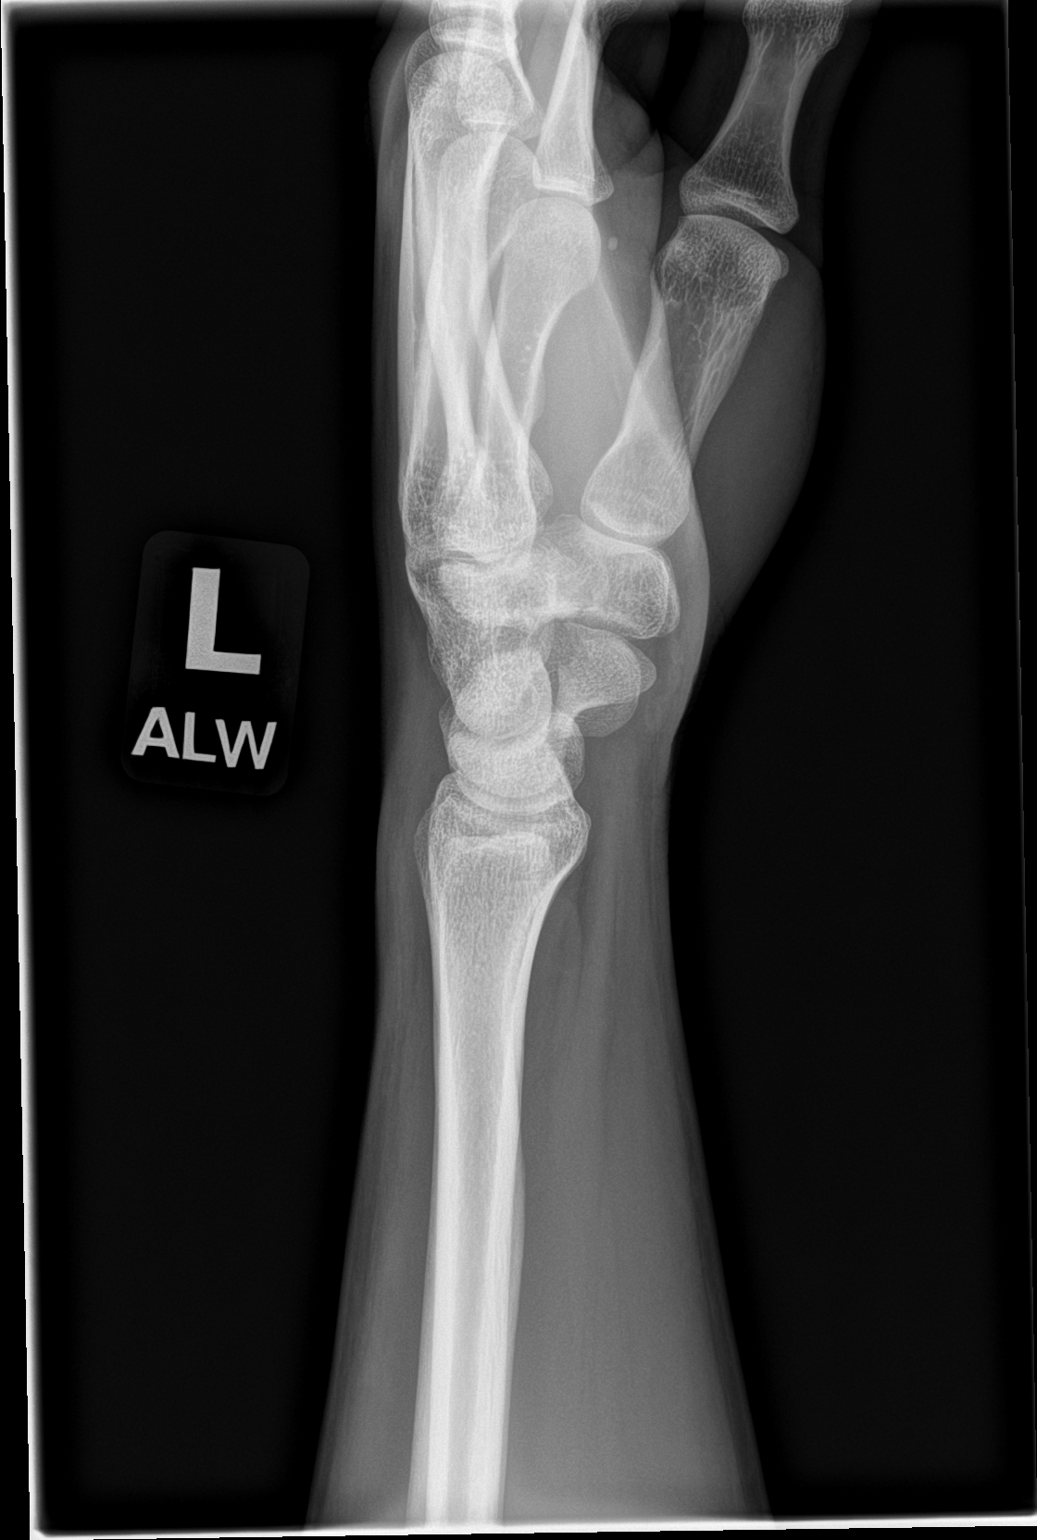
[im 4/4]
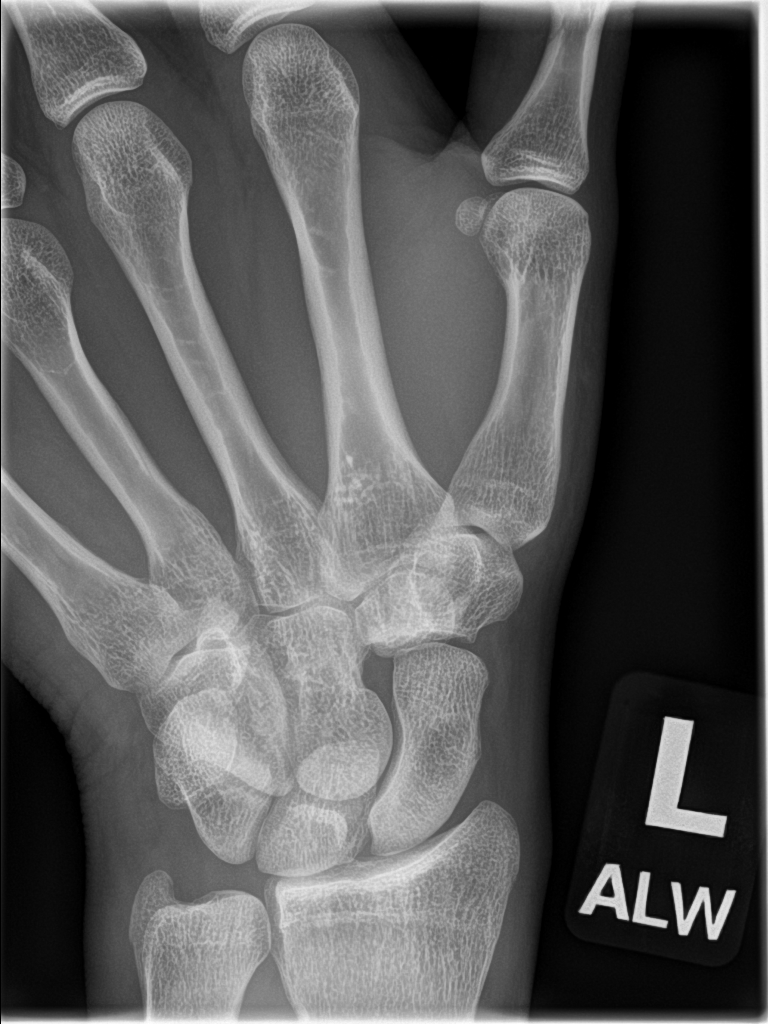

[4 of 4 positions shown; findings below may reference images not displayed]

FINDINGS: There is no evidence of fracture or dislocation. There is no
evidence of arthropathy or other focal bone abnormality. Soft
tissues are unremarkable.
IMPRESSION: Negative.

## 2022-04-04 DIAGNOSIS — L739 Follicular disorder, unspecified: Secondary | ICD-10-CM | POA: Diagnosis not present

## 2022-04-04 DIAGNOSIS — L700 Acne vulgaris: Secondary | ICD-10-CM | POA: Diagnosis not present

## 2022-11-29 ENCOUNTER — Ambulatory Visit (INDEPENDENT_AMBULATORY_CARE_PROVIDER_SITE_OTHER): Payer: BC Managed Care – PPO | Admitting: Adult Health

## 2022-11-29 ENCOUNTER — Telehealth: Payer: Self-pay | Admitting: Adult Health

## 2022-11-29 ENCOUNTER — Encounter: Payer: Self-pay | Admitting: Adult Health

## 2022-11-29 VITALS — BP 120/100 | HR 77 | Temp 98.1°F | Ht 74.0 in | Wt 205.0 lb

## 2022-11-29 DIAGNOSIS — R5383 Other fatigue: Secondary | ICD-10-CM | POA: Diagnosis not present

## 2022-11-29 DIAGNOSIS — Z0000 Encounter for general adult medical examination without abnormal findings: Secondary | ICD-10-CM

## 2022-11-29 DIAGNOSIS — N5089 Other specified disorders of the male genital organs: Secondary | ICD-10-CM

## 2022-11-29 DIAGNOSIS — Z0001 Encounter for general adult medical examination with abnormal findings: Secondary | ICD-10-CM | POA: Diagnosis not present

## 2022-11-29 LAB — COMPREHENSIVE METABOLIC PANEL
ALT: 54 U/L — ABNORMAL HIGH (ref 0–53)
AST: 37 U/L (ref 0–37)
Albumin: 4.6 g/dL (ref 3.5–5.2)
Alkaline Phosphatase: 55 U/L (ref 39–117)
BUN: 21 mg/dL (ref 6–23)
CO2: 26 mEq/L (ref 19–32)
Calcium: 9.6 mg/dL (ref 8.4–10.5)
Chloride: 104 mEq/L (ref 96–112)
Creatinine, Ser: 0.93 mg/dL (ref 0.40–1.50)
GFR: 114.58 mL/min (ref >60.00)
Glucose, Bld: 90 mg/dL (ref 70–99)
Potassium: 4.1 mEq/L (ref 3.5–5.1)
Sodium: 139 mEq/L (ref 135–145)
Total Bilirubin: 0.3 mg/dL (ref 0.2–1.2)
Total Protein: 7.2 g/dL (ref 6.0–8.3)

## 2022-11-29 LAB — CBC
HCT: 47.6 % (ref 39.0–52.0)
Hemoglobin: 15.1 g/dL (ref 13.0–17.0)
MCHC: 31.7 g/dL (ref 30.0–36.0)
MCV: 84.5 fl (ref 78.0–100.0)
Platelets: 242 10*3/uL (ref 150.0–400.0)
RBC: 5.64 Mil/uL (ref 4.22–5.81)
RDW: 13.5 % (ref 11.5–15.5)
WBC: 5.2 10*3/uL (ref 4.0–10.5)

## 2022-11-29 LAB — TESTOSTERONE: Testosterone: 478.86 ng/dL (ref 300.00–890.00)

## 2022-11-29 LAB — TSH: TSH: 1.09 u[IU]/mL (ref 0.35–5.50)

## 2022-11-29 NOTE — Telephone Encounter (Signed)
Joyce Gross with [facility] is calling and would like to know if US scrotum with doppler or not

## 2022-11-29 NOTE — Telephone Encounter (Signed)
Tried to call Joyce Gross multilple times through out the day but no answer. We can do the [place] with doppler.

## 2022-11-29 NOTE — Progress Notes (Signed)
Subjective:    Patient ID: Kyle Woodward, male    DOB: June 12, 1995, 27 y.o.   MRN: [id number]  HPI Patient presents for yearly preventative medicine examination. He is a pleasant 27 year old male who  has a past medical history of Asthma, Bipolar 1 disorder ([facility]), Depression, and Plaque psoriasis. He was last seen in 11/2019.   He is happy to report that he is expecting a new baby  Polysubstance Abuse - has been sober for 5 years!   Fatigue - Reports that over the last few months he has been very fatigued. He works long hours outside  and is taking care of his pregnant girlfriend. He would like his testosterone levels checked.   Testicle mass - he reports having a small mass on his left testicle since he was a teenager. Feels as though this mass has changed and he would like to get it checked out again ( I do not have previous imaging results). He does report pain at times?   All immunizations and health maintenance protocols were reviewed with the patient and needed orders were placed.  Appropriate screening laboratory values were ordered for the patient including screening of hyperlipidemia, renal function and hepatic function.  Medication reconciliation,  past medical history, social history, problem list and allergies were reviewed in detail with the patient  Goals were established with regard to weight loss, exercise, and  diet in compliance with medications. He has not been exercising like he has in the past. He has gained about 20 pounds.  Wt Readings from Last 3 Encounters:  11/29/22 205 lb (93 kg)  09/22/20 180 lb (81.6 kg)  12/02/19 171 lb (77.6 kg)    Review of Systems  Constitutional: Negative.   HENT: Negative.    Eyes: Negative.   Respiratory: Negative.    Cardiovascular: Negative.   Gastrointestinal: Negative.   Endocrine: Negative.   Genitourinary:  Positive for testicular pain. Negative for penile swelling and scrotal swelling.  Musculoskeletal: Negative.    Skin: Negative.   Allergic/Immunologic: Negative.   Neurological: Negative.   Hematological: Negative.   Psychiatric/Behavioral: Negative.    All other systems reviewed and are negative.  Past Medical History:  Diagnosis Date   Asthma    Bipolar 1 disorder (HCC)    Depression    Plaque psoriasis     Social History   Socioeconomic History   Marital status: Significant Other    Spouse name: Not on file   Number of children: Not on file   Years of education: Not on file   Highest education level: Not on file  Occupational History   Not on file  Tobacco Use   Smoking status: Former    Current packs/day: 0.00    Types: Cigarettes    Quit date: 04/06/2018    Years since quitting: 4.6   Smokeless tobacco: Never  Vaping Use   Vaping status: Every Day  Substance and Sexual Activity   Alcohol use: Yes    Comment: socially   Drug use: Yes    Types: Marijuana, Heroin, Oxycodone, Codeine    Comment: occasionally   Sexual activity: Yes    Partners: Female  Other Topics Concern   Not on file  Social History Narrative   ** Merged History Encounter **       Social Determinants of Health   Financial Resource Strain: Not on file  Food Insecurity: Not on file  Transportation Needs: Not on file  Physical Activity: Not on  file  Stress: Not on file  Social Connections: Not on file  Intimate Partner Violence: Not on file    Past Surgical History:  Procedure Laterality Date   ADENOIDECTOMY     TONSILLECTOMY     WISDOM TOOTH EXTRACTION      Family History  Problem Relation Age of Onset   Diabetes Mother    Hypertension Mother    Alcohol abuse Mother    Depression Mother    Hyperlipidemia Mother    Alcohol abuse Father    Depression Father    Hypertension Father    Arthritis Maternal Grandmother    Hyperlipidemia Maternal Grandmother    Hypertension Maternal Grandmother     Allergies  Allergen Reactions   Influenza Vaccines     Local reaction to injection  site - swelling Overall feeling bad after getting the vaccine   Zithromax [Azithromycin] Hives    Current Outpatient Medications on File Prior to Visit  Medication Sig Dispense Refill   Multiple Vitamins-Minerals (CENTRUM ADULT PO) Take by mouth.     HUMIRA PEN 40 MG/0.4ML PNKT Inject 40 mg into the muscle as needed.  10   No current facility-administered medications on file prior to visit.    BP (!) 120/100   Pulse 77   Temp 98.1 F (36.7 C) (Oral)   Ht 6\' 2"  (1.88 m)   Wt 205 lb (93 kg)   SpO2 96%   BMI 26.32 kg/m       Objective:   Physical Exam Vitals and nursing note reviewed.  Constitutional:      General: He is not in acute distress.    Appearance: Normal appearance. He is not ill-appearing.  HENT:     Head: Normocephalic and atraumatic.     Right Ear: Tympanic membrane, ear canal and external ear normal. There is no impacted cerumen.     Left Ear: Tympanic membrane, ear canal and external ear normal. There is no impacted cerumen.     Nose: Nose normal. No congestion or rhinorrhea.     Mouth/Throat:     Mouth: Mucous membranes are moist.     Pharynx: Oropharynx is clear.  Eyes:     Extraocular Movements: Extraocular movements intact.     Conjunctiva/sclera: Conjunctivae normal.     Pupils: Pupils are equal, round, and reactive to light.  Neck:     Vascular: No carotid bruit.  Cardiovascular:     Rate and Rhythm: Normal rate and regular rhythm.     Pulses: Normal pulses.     Heart sounds: No murmur heard.    No friction rub. No gallop.  Pulmonary:     Effort: Pulmonary effort is normal.     Breath sounds: Normal breath sounds.  Abdominal:     General: Abdomen is flat. Bowel sounds are normal. There is no distension.     Palpations: Abdomen is soft. There is no mass.     Tenderness: There is no abdominal tenderness. There is no guarding or rebound.     Hernia: No hernia is present.  Genitourinary:    Testes:        Left: Tenderness or swelling not  present.     Comments: ? Varicocele at site of " mass"  Musculoskeletal:        General: Normal range of motion.     Cervical back: Normal range of motion and neck supple.  Lymphadenopathy:     Cervical: No cervical adenopathy.  Skin:    General: Skin  is warm and dry.     Capillary Refill: Capillary refill takes less than 2 seconds.  Neurological:     General: No focal deficit present.     Mental Status: He is alert and oriented to person, place, and time.  Psychiatric:        Mood and Affect: Mood normal.        Behavior: Behavior normal.        Thought Content: Thought content normal.        Judgment: Judgment normal.       Assessment & Plan:  1. Routine general medical examination at a health care facility Today patient counseled on age appropriate routine health concerns for screening and prevention, each reviewed and up to date or declined. Immunizations reviewed and up to date or declined. Labs ordered and reviewed. Risk factors for depression reviewed and negative. Hearing function and visual acuity are intact. ADLs screened and addressed as needed. Functional ability and level of safety reviewed and appropriate. Education, counseling and referrals performed based on assessed risks today. Patient provided with a copy of personalized plan for preventive services. - Encouraged to get back in the gym and eat healthy  - Follow up in 1 year or sooner if needed  2. Mass of left testicle  - US Scrotum; Future  3. Other fatigue - Likely not low testosterone or his age, fatigue is likely from working long hours and not getting enough sleep. He still wants his testosterone level cheked - CBC; Future - Comprehensive metabolic panel; Future - Testosterone; Future - TSH; Future  Shirline Frees, NP

## 2022-11-30 NOTE — Telephone Encounter (Signed)
Desiree from [employer] notified of message below.

## 2022-12-01 ENCOUNTER — Encounter: Payer: Self-pay | Admitting: Adult Health

## 2022-12-03 NOTE — Telephone Encounter (Signed)
Please advise 

## 2022-12-06 ENCOUNTER — Other Ambulatory Visit: Payer: BC Managed Care – PPO

## 2022-12-10 ENCOUNTER — Ambulatory Visit
Admission: RE | Admit: 2022-12-10 | Discharge: 2022-12-10 | Disposition: A | Discharge status: Home / Self Care | Payer: BC Managed Care – PPO | Source: Ambulatory Visit | Attending: Adult Health | Admitting: Adult Health

## 2022-12-10 DIAGNOSIS — N503 Cyst of epididymis: Secondary | ICD-10-CM | POA: Diagnosis not present

## 2022-12-10 DIAGNOSIS — N5089 Other specified disorders of the male genital organs: Secondary | ICD-10-CM

## 2023-05-11 ENCOUNTER — Encounter (HOSPITAL_BASED_OUTPATIENT_CLINIC_OR_DEPARTMENT_OTHER): Payer: Self-pay

## 2023-05-11 ENCOUNTER — Ambulatory Visit (HOSPITAL_BASED_OUTPATIENT_CLINIC_OR_DEPARTMENT_OTHER)
Admission: RE | Admit: 2023-05-11 | Discharge: 2023-05-11 | Disposition: A | Discharge status: Home / Self Care | Payer: BC Managed Care – PPO | Source: Ambulatory Visit | Attending: Family Medicine | Admitting: Family Medicine

## 2023-05-11 VITALS — BP 143/82 | HR 83 | Temp 98.1°F | Resp 20

## 2023-05-11 DIAGNOSIS — J4521 Mild intermittent asthma with (acute) exacerbation: Secondary | ICD-10-CM | POA: Diagnosis not present

## 2023-05-11 DIAGNOSIS — J019 Acute sinusitis, unspecified: Secondary | ICD-10-CM | POA: Diagnosis not present

## 2023-05-11 MED ORDER — DOXYCYCLINE HYCLATE 100 MG PO CAPS: 100.0000 mg | ORAL_CAPSULE | Freq: Two times a day (BID) | ORAL | 0 refills | Status: AC

## 2023-05-11 MED ORDER — PREDNISONE 20 MG PO TABS: 40.0000 mg | ORAL_TABLET | Freq: Every day | ORAL | 0 refills | Status: AC

## 2023-05-11 MED ORDER — ALBUTEROL SULFATE HFA 108 (90 BASE) MCG/ACT IN AERS: 2.0000 | INHALATION_SPRAY | RESPIRATORY_TRACT | 0 refills | Status: AC | PRN

## 2023-05-11 MED ORDER — BENZONATATE 100 MG PO CAPS: 100.0000 mg | ORAL_CAPSULE | Freq: Three times a day (TID) | ORAL | 0 refills | Status: DC | PRN

## 2023-05-11 NOTE — ED Provider Notes (Addendum)
[name]    CSN: [id number] Arrival date & time: 05/11/23  1458      History   Chief Complaint Chief Complaint  Patient presents with   Wheezing    Since last sunday night i've experienced sore throat, low grade fever,extreme Mucus, coughing it up, i thought i was better yesterday and went back to work, last night the coughing came back with a vengeance and this morning whooping cough dark mucus - Entered by patient   Cough   Fever    HPI Kyle Woodward is a 28 y.o. male.    Wheezing Associated symptoms: cough and fever   Cough Associated symptoms: fever and wheezing   Fever Associated symptoms: cough   Here for cough and congestion and wheezing.  Symptoms began on January 12 or January 11.  The first day or 2 we had some fever but that is resolved by January 14.  He has had copious rhinorrhea and nasal congestion.  It did improve some and then it worsened again in the last 72 hours.  He has wheezed off and on and has a history of asthma when he was much younger.  No vomiting or diarrhea  He is allergic to azithromycin which possibly gave him a rash when he was younger. Past Medical History:  Diagnosis Date   Asthma    Bipolar 1 disorder (HCC)    Depression    Plaque psoriasis     Patient Active Problem List   Diagnosis Date Noted   GAD (generalized anxiety disorder) 04/16/2018   Bipolar disorder (HCC) 04/16/2018   Psoriasis 05/06/2017    Past Surgical History:  Procedure Laterality Date   ADENOIDECTOMY     TONSILLECTOMY     WISDOM TOOTH EXTRACTION         Home Medications    Prior to Admission medications   Medication Sig Start Date End Date Taking? Authorizing Provider  albuterol (VENTOLIN HFA) 108 (90 Base) MCG/ACT inhaler Inhale 2 puffs into the lungs every 4 (four) hours as needed for wheezing or shortness of breath. 05/11/23  Yes Anurag Scarfo, Janace Aris, MD  benzonatate (TESSALON) 100 MG capsule Take 1 capsule (100 mg total) by mouth 3  (three) times daily as needed for cough. 05/11/23  Yes Johnette Teigen, Janace Aris, MD  doxycycline (VIBRAMYCIN) 100 MG capsule Take 1 capsule (100 mg total) by mouth 2 (two) times daily for 7 days. 05/11/23 05/18/23 Yes Zenia Resides, MD  predniSONE (DELTASONE) 20 MG tablet Take 2 tablets (40 mg total) by mouth daily with breakfast for 5 days. 05/11/23 05/16/23 Yes Zeinab Rodwell, Janace Aris, MD  HUMIRA PEN 40 MG/0.4ML PNKT Inject 40 mg into the muscle as needed. 02/04/18   [provider]  Multiple Vitamins-Minerals (CENTRUM ADULT PO) Take by mouth.    [provider]    Family History Family History  Problem Relation Age of Onset   Diabetes Mother    Hypertension Mother    Alcohol abuse Mother    Depression Mother    Hyperlipidemia Mother    Alcohol abuse Father    Depression Father    Hypertension Father    Arthritis Maternal Grandmother    Hyperlipidemia Maternal Grandmother    Hypertension Maternal Grandmother     Social History Social History   Tobacco Use   Smoking status: Former    Current packs/day: 0.00    Types: Cigarettes    Quit date: 04/06/2018    Years since quitting: 5.0   Smokeless  tobacco: Never  Vaping Use   Vaping status: Every Day  Substance Use Topics   Alcohol use: Yes    Comment: socially   Drug use: Yes    Types: Marijuana, Heroin, Oxycodone, Codeine    Comment: occasionally     Allergies   Influenza vaccines and Zithromax [azithromycin]   Review of Systems Review of Systems  Constitutional:  Positive for fever.  Respiratory:  Positive for cough and wheezing.      Physical Exam Triage Vital Signs ED Triage Vitals  Encounter Vitals Group     BP 05/11/23 1511 (!) 143/82     Systolic BP Percentile --      Diastolic BP Percentile --      Pulse Rate 05/11/23 1511 83     Resp 05/11/23 1511 20     Temp 05/11/23 1511 98.1 F (36.7 C)     Temp Source 05/11/23 1511 Oral     SpO2 05/11/23 1511 95 %     Weight --      Height --       Head Circumference --      Peak Flow --      Pain Score 05/11/23 1513 5     Pain Loc --      Pain Education --      Exclude from Growth Chart --    No data found.  Updated Vital Signs BP (!) 143/82 (BP Location: Right Arm)   Pulse 83   Temp 98.1 F (36.7 C) (Oral)   Resp 20   SpO2 95%   Visual Acuity Right Eye Distance:   Left Eye Distance:   Bilateral Distance:    Right Eye Near:   Left Eye Near:    Bilateral Near:     Physical Exam Vitals reviewed.  Constitutional:      General: He is not in acute distress.    Appearance: He is not ill-appearing, toxic-appearing or diaphoretic.  HENT:     Right Ear: Tympanic membrane and ear canal normal.     Left Ear: Tympanic membrane and ear canal normal.     Nose: Congestion and rhinorrhea present.     Mouth/Throat:     Mouth: Mucous membranes are moist.     Comments: White exudate is draining.  No erythema Eyes:     Extraocular Movements: Extraocular movements intact.     Conjunctiva/sclera: Conjunctivae normal.     Pupils: Pupils are equal, round, and reactive to light.  Cardiovascular:     Rate and Rhythm: Normal rate and regular rhythm.     Heart sounds: No murmur heard. Pulmonary:     Effort: Pulmonary effort is normal. No respiratory distress.     Breath sounds: Normal breath sounds. No stridor. No wheezing, rhonchi or rales.  Musculoskeletal:     Cervical back: Neck supple.  Lymphadenopathy:     Cervical: No cervical adenopathy.  Skin:    Capillary Refill: Capillary refill takes less than 2 seconds.     Coloration: Skin is not jaundiced or pale.  Neurological:     General: No focal deficit present.     Mental Status: He is alert and oriented to person, place, and time.  Psychiatric:        Behavior: Behavior normal.      UC Treatments / Results  Labs (all labs ordered are listed, but only abnormal results are displayed) Labs Reviewed - No data to display  EKG   Radiology No results  found.  Procedures Procedures (including critical care time)  Medications Ordered in UC Medications - No data to display  Initial Impression / Assessment and Plan / UC Course  I have reviewed the triage vital signs and the nursing notes.  Pertinent labs & imaging results that were available during my care of the patient were reviewed by me and considered in my medical decision making (see chart for details).     Doxycycline is sent in for sinusitis since he has a double sickening pattern.  Albuterol and prednisone are sent in for possible asthma exacerbation since has been wheezing.  Tessalon Perles are sent in for cough Final Clinical Impressions(s) / UC Diagnoses   Final diagnoses:  Acute sinusitis, recurrence not specified, unspecified location  Mild intermittent asthma with acute exacerbation     Discharge Instructions      Take doxycycline 100 mg --1 capsule 2 times daily for 7 days  Albuterol inhaler--do 2 puffs every 4 hours as needed for shortness of breath or wheezing   Take prednisone 20 mg--2 daily for 5 days  Take benzonatate 100 mg, 1 tab every 8 hours as needed for cough.       ED Prescriptions     Medication Sig Dispense Auth. Provider   doxycycline (VIBRAMYCIN) 100 MG capsule Take 1 capsule (100 mg total) by mouth 2 (two) times daily for 7 days. 14 capsule Ilyas Lipsitz, Janace Aris, MD   albuterol (VENTOLIN HFA) 108 (90 Base) MCG/ACT inhaler Inhale 2 puffs into the lungs every 4 (four) hours as needed for wheezing or shortness of breath. 1 each Zenia Resides, MD   benzonatate (TESSALON) 100 MG capsule Take 1 capsule (100 mg total) by mouth 3 (three) times daily as needed for cough. 21 capsule Zenia Resides, MD   predniSONE (DELTASONE) 20 MG tablet Take 2 tablets (40 mg total) by mouth daily with breakfast for 5 days. 10 tablet Marlinda Mike Janace Aris, MD      PDMP not reviewed this encounter.   Zenia Resides, MD 05/11/23 1545    Zenia Resides, MD 05/11/23 [phone]

## 2023-05-11 NOTE — ED Triage Notes (Signed)
Recent cough, sore throat, low grade fever. Cough is productive. States symptoms improve slightly but over last 2 days, symptoms worse. Asthma when younger but reports until now, it hasn't been a problem for him.

## 2023-05-11 NOTE — Discharge Instructions (Addendum)
Take doxycycline 100 mg --1 capsule 2 times daily for 7 days  Albuterol inhaler--do 2 puffs every 4 hours as needed for shortness of breath or wheezing   Take prednisone 20 mg--2 daily for 5 days  Take benzonatate 100 mg, 1 tab every 8 hours as needed for cough.

## 2023-12-25 ENCOUNTER — Encounter: Payer: Self-pay | Admitting: Adult Health

## 2023-12-25 NOTE — Telephone Encounter (Signed)
 Ok to schedule pt?

## 2024-01-06 ENCOUNTER — Encounter (HOSPITAL_BASED_OUTPATIENT_CLINIC_OR_DEPARTMENT_OTHER): Payer: Self-pay

## 2024-01-06 ENCOUNTER — Ambulatory Visit (HOSPITAL_BASED_OUTPATIENT_CLINIC_OR_DEPARTMENT_OTHER)
Admission: RE | Admit: 2024-01-06 | Discharge: 2024-01-06 | Disposition: A | Discharge status: Home / Self Care | Source: Ambulatory Visit | Attending: Family Medicine | Admitting: Family Medicine

## 2024-01-06 VITALS — BP 122/82 | HR 65 | Temp 97.8°F | Resp 18

## 2024-01-06 DIAGNOSIS — R1012 Left upper quadrant pain: Secondary | ICD-10-CM

## 2024-01-06 DIAGNOSIS — R1011 Right upper quadrant pain: Secondary | ICD-10-CM

## 2024-01-06 DIAGNOSIS — R197 Diarrhea, unspecified: Secondary | ICD-10-CM | POA: Diagnosis not present

## 2024-01-06 DIAGNOSIS — R1031 Right lower quadrant pain: Secondary | ICD-10-CM | POA: Diagnosis not present

## 2024-01-06 DIAGNOSIS — R1032 Left lower quadrant pain: Secondary | ICD-10-CM | POA: Diagnosis not present

## 2024-01-06 DIAGNOSIS — R52 Pain, unspecified: Secondary | ICD-10-CM

## 2024-01-06 DIAGNOSIS — R509 Fever, unspecified: Secondary | ICD-10-CM

## 2024-01-06 DIAGNOSIS — R110 Nausea: Secondary | ICD-10-CM

## 2024-01-06 DIAGNOSIS — L559 Sunburn, unspecified: Secondary | ICD-10-CM

## 2024-01-06 DIAGNOSIS — R102 Pelvic and perineal pain: Secondary | ICD-10-CM

## 2024-01-06 LAB — POCT URINE DIPSTICK
Bilirubin, UA: NEGATIVE
Blood, UA: NEGATIVE
Glucose, UA: NEGATIVE mg/dL
Ketones, POC UA: NEGATIVE mg/dL
Leukocytes, UA: NEGATIVE
Nitrite, UA: NEGATIVE
Protein Ur, POC: NEGATIVE mg/dL
Spec Grav, UA: 1.01 (ref 1.010–1.025)
Urobilinogen, UA: 0.2 U/dL
pH, UA: 7 (ref 5.0–8.0)

## 2024-01-06 LAB — POC SOFIA SARS ANTIGEN FIA: SARS Coronavirus 2 Ag: NEGATIVE

## 2024-01-06 MED ORDER — ONDANSETRON 4 MG PO TBDP: 4.0000 mg | ORAL_TABLET | Freq: Three times a day (TID) | ORAL | 0 refills | Status: DC | PRN

## 2024-01-06 MED ORDER — LOPERAMIDE HCL 2 MG PO CAPS: ORAL_CAPSULE | ORAL | 0 refills | Status: DC

## 2024-01-06 NOTE — ED Triage Notes (Signed)
 Pt reports fever, chills, diarrhea started yesterday morning he also was sun burnt on Saturday. He thinks he had food poisoning he ate at some [employer] on Saturday.

## 2024-01-06 NOTE — ED Provider Notes (Signed)
 [facility]    CSN: [id number] Arrival date & time: 01/06/24  1028      History   Chief Complaint Chief Complaint  Patient presents with   Diarrhea    HPI Kyle Woodward is a 28 y.o. male.   28 year old male who reports fever, chills, diarrhea, abdominal pain and nausea but no vomiting that started on 01/05/2019 5 in the morning.  On 01/04/2024, he worked outside on [employer]eronaut[employer] at [employer] in [place].  He got a little bit of a sunburn on his left shoulder and left upper arm.  There were no blisters and it did not feel like sun poisoning.  They ate a meal at the restaurant and he worries that he got food poisoning from the restaurant.  He is super fatigued and spent all of yesterday in bed.  He is having 10+ liquid diarrhea stools daily or more.   Diarrhea Associated symptoms: abdominal pain, chills and fever   Associated symptoms: no arthralgias and no vomiting     Past Medical History:  Diagnosis Date   Asthma    Bipolar 1 disorder (HCC)    Depression    Plaque psoriasis     Patient Active Problem List   Diagnosis Date Noted   GAD (generalized anxiety disorder) 04/16/2018   Bipolar disorder (HCC) 04/16/2018   Psoriasis 05/06/2017    Past Surgical History:  Procedure Laterality Date   ADENOIDECTOMY     TONSILLECTOMY     WISDOM TOOTH EXTRACTION         Home Medications    Prior to Admission medications   Medication Sig Start Date End Date Taking? Authorizing Provider  loperamide  (IMODIUM ) 2 MG capsule Loperamide  2 mg, 1 pill, every 12 hours if needed for diarrhea.  Be cautious with use as constipation could occur quickly. 01/06/24  Yes Ival Domino, FNP  ondansetron  (ZOFRAN -ODT) 4 MG disintegrating tablet Take 1 tablet (4 mg total) by mouth every 8 (eight) hours as needed for nausea or vomiting. 01/06/24  Yes Ival Domino, FNP  albuterol  (VENTOLIN  HFA) 108 (90 Base) MCG/ACT inhaler Inhale 2 puffs into the lungs every 4 (four) hours as needed  for wheezing or shortness of breath. 05/11/23   Vonna Sharlet POUR, MD  benzonatate  (TESSALON ) 100 MG capsule Take 1 capsule (100 mg total) by mouth 3 (three) times daily as needed for cough. 05/11/23   Banister, Sharlet POUR, MD  HUMIRA PEN 40 MG/0.4ML PNKT Inject 40 mg into the muscle as needed. 02/04/18   [provider]  Multiple Vitamins-Minerals (CENTRUM ADULT PO) Take by mouth.    [provider]    Family History Family History  Problem Relation Age of Onset   Diabetes Mother    Hypertension Mother    Alcohol abuse Mother    Depression Mother    Hyperlipidemia Mother    Alcohol abuse Father    Depression Father    Hypertension Father    Arthritis Maternal Grandmother    Hyperlipidemia Maternal Grandmother    Hypertension Maternal Grandmother     Social History Social History   Tobacco Use   Smoking status: Former    Current packs/day: 0.00    Types: Cigarettes    Quit date: 04/06/2018    Years since quitting: 5.7   Smokeless tobacco: Never  Vaping Use   Vaping status: Every Day  Substance Use Topics   Alcohol use: Yes    Comment: socially   Drug use: Yes  Types: Marijuana, Heroin, Oxycodone, Codeine    Comment: occasionally     Allergies   Influenza vaccines and Zithromax [azithromycin]   Review of Systems Review of Systems  Constitutional:  Positive for chills, fatigue and fever.  HENT:  Negative for ear pain and sore throat.   Eyes:  Negative for pain and visual disturbance.  Respiratory:  Negative for cough.   Cardiovascular:  Negative for chest pain and palpitations.  Gastrointestinal:  Positive for abdominal pain and diarrhea. Negative for constipation, nausea and vomiting.  Genitourinary:  Negative for dysuria and hematuria.  Musculoskeletal:  Negative for arthralgias and back pain.  Skin:  Positive for color change (Sunburn). Negative for rash.  Neurological:  Negative for seizures and syncope.  All other systems reviewed and are  negative.    Physical Exam Triage Vital Signs ED Triage Vitals  Encounter Vitals Group     BP 01/06/24 1114 122/82     Girls Systolic BP Percentile --      Girls Diastolic BP Percentile --      Boys Systolic BP Percentile --      Boys Diastolic BP Percentile --      Pulse Rate 01/06/24 1114 65     Resp 01/06/24 1114 18     Temp 01/06/24 1114 97.8 F (36.6 C)     Temp Source 01/06/24 1114 Oral     SpO2 01/06/24 1114 97 %     Weight --      Height --      Head Circumference --      Peak Flow --      Pain Score 01/06/24 1113 0     Pain Loc --      Pain Education --      Exclude from Growth Chart --    No data found.  Updated Vital Signs BP 122/82 (BP Location: Right Arm)   Pulse 65   Temp 97.8 F (36.6 C) (Oral)   Resp 18   SpO2 97%   Visual Acuity Right Eye Distance:   Left Eye Distance:   Bilateral Distance:    Right Eye Near:   Left Eye Near:    Bilateral Near:     Physical Exam Vitals and nursing note reviewed.  Constitutional:      General: He is not in acute distress.    Appearance: He is well-developed. He is ill-appearing. He is not toxic-appearing or diaphoretic.  HENT:     Head: Normocephalic and atraumatic.     Right Ear: Hearing, tympanic membrane, ear canal and external ear normal.     Left Ear: Hearing, tympanic membrane, ear canal and external ear normal.     Nose: No congestion or rhinorrhea.     Right Sinus: No maxillary sinus tenderness or frontal sinus tenderness.     Left Sinus: No maxillary sinus tenderness or frontal sinus tenderness.     Mouth/Throat:     Lips: Pink.     Mouth: Mucous membranes are moist.     Pharynx: Uvula midline. No oropharyngeal exudate or posterior oropharyngeal erythema.     Tonsils: No tonsillar exudate.  Eyes:     Conjunctiva/sclera: Conjunctivae normal.     Pupils: Pupils are equal, round, and reactive to light.  Cardiovascular:     Rate and Rhythm: Normal rate and regular rhythm.     Heart sounds: S1  normal and S2 normal. No murmur heard. Pulmonary:     Effort: Pulmonary effort is normal. No respiratory distress.  Breath sounds: Normal breath sounds. No decreased breath sounds, wheezing, rhonchi or rales.  Abdominal:     General: Bowel sounds are normal.     Palpations: Abdomen is soft.     Tenderness: There is generalized abdominal tenderness (Mild) and tenderness in the right upper quadrant, right lower quadrant, suprapubic area, left upper quadrant and left lower quadrant. There is no right CVA tenderness, left CVA tenderness, guarding or rebound. Negative signs include Murphy's sign, Rovsing's sign and McBurney's sign.  Musculoskeletal:        General: No swelling.     Cervical back: Neck supple.  Lymphadenopathy:     Head:     Right side of head: No submental, submandibular, tonsillar, preauricular or posterior auricular adenopathy.     Left side of head: No submental, submandibular, tonsillar, preauricular or posterior auricular adenopathy.     Cervical: No cervical adenopathy.     Right cervical: No superficial cervical adenopathy.    Left cervical: No superficial cervical adenopathy.  Skin:    General: Skin is warm and dry.     Capillary Refill: Capillary refill takes less than 2 seconds.     Findings: Burn (Minimal erythema of the left upper arm and left posterior shoulder from sunburn.  Minimal tenderness.  No blistering or peeling of skin.) present. No rash.  Neurological:     Mental Status: He is alert and oriented to person, place, and time.  Psychiatric:        Mood and Affect: Mood normal.      UC Treatments / Results  Labs (all labs ordered are listed, but only abnormal results are displayed) Labs Reviewed  POC SOFIA SARS ANTIGEN FIA - Normal  POCT URINE DIPSTICK - Normal    EKG   Radiology No results found.  Procedures Procedures (including critical care time)  Medications Ordered in UC Medications - No data to display  Initial Impression /  Assessment and Plan / UC Course  I have reviewed the triage vital signs and the nursing notes.  Pertinent labs & imaging results that were available during my care of the patient were reviewed by me and considered in my medical decision making (see chart for details).  Plan of Care: Diarrhea, nausea without vomiting and abdominal pain: See discharge instructions for specifics including patient education.  Use loperamide , 2 mg, 1 pill every 12 hours if needed for diarrhea.  Provided ondansetron , 4 mg ODT, every 8 hours if needed for nausea or vomiting.  Get plenty of fluids and rest and work to avoid dehydration.  COVID test was negative.  Urine is clear and did not show signs of dehydration.  Sunburn: This is resolving and is not a significant problem.  Work excuse provided.  Follow-up if symptoms do not improve, worsen or new symptoms occur  I reviewed the plan of care with the patient and/or the patient's guardian.  The patient and/or guardian had time to ask questions and acknowledged that the questions were answered.  I provided instruction on symptoms or reasons to return here or to go to an ER, if symptoms/condition did not improve, worsened or if new symptoms occurred.  Final Clinical Impressions(s) / UC Diagnoses   Final diagnoses:  Fever, unspecified  Generalized body aches  Left lower quadrant abdominal pain  Left upper quadrant abdominal pain  Right lower quadrant abdominal pain  Right upper quadrant abdominal pain  Acute suprapubic pain  Nausea without vomiting  Diarrhea, unspecified type  Burn from the sun  Discharge Instructions      Diarrhea with abdominal pain and nausea without vomiting: Use loperamide  or Imodium , 2 mg, 1 pill twice daily if needed for diarrhea.  Be cautious with use of loperamide  as it could cause constipation quickly.  Ondansetron , 4 mg, melts on tongue, every 8 hours if needed for nausea or vomiting.  COVID test was negative.  Urinalysis is  normal and does not show signs of dehydration.  Provided information about diarrhea and diet to help reduce diarrhea.  Provided handouts about dehydration and rehydration.  Work excuse provided.  Follow-up if symptoms do not improve, worsen or new symptoms occur.  Get help right away if: You have symptoms of severe dehydration. You vomit every time you eat or drink. Your vomiting gets worse, does not go away, or includes blood or green matter (bile). You are getting treatment but symptoms are getting worse. You have a fever. You have a severe headache. You have: Diarrhea that gets worse or does not go away. Blood in your stool. This may cause stool to look black and tarry. Not urinating, or urinating only a small amount of very dark urine, within 6-8 hours. You have trouble breathing. These symptoms may be an emergency. Get help right away. Do not wait to see if the symptoms will go away. Do not drive yourself to the hospital. Call 911.     ED Prescriptions     Medication Sig Dispense Auth. Provider   ondansetron  (ZOFRAN -ODT) 4 MG disintegrating tablet Take 1 tablet (4 mg total) by mouth every 8 (eight) hours as needed for nausea or vomiting. 20 tablet Barrie Sigmund, FNP   loperamide  (IMODIUM ) 2 MG capsule Loperamide  2 mg, 1 pill, every 12 hours if needed for diarrhea.  Be cautious with use as constipation could occur quickly. 12 capsule Ival Domino, FNP      PDMP not reviewed this encounter.   Ival Domino, FNP 01/06/24 1201

## 2024-01-06 NOTE — Discharge Instructions (Addendum)
 Diarrhea with abdominal pain and nausea without vomiting: Use loperamide  or Imodium , 2 mg, 1 pill twice daily if needed for diarrhea.  Be cautious with use of loperamide  as it could cause constipation quickly.  Ondansetron , 4 mg, melts on tongue, every 8 hours if needed for nausea or vomiting.  COVID test was negative.  Urinalysis is normal and does not show signs of dehydration.  Provided information about diarrhea and diet to help reduce diarrhea.  Provided handouts about dehydration and rehydration.  Work excuse provided.  Follow-up if symptoms do not improve, worsen or new symptoms occur.  Get help right away if: You have symptoms of severe dehydration. You vomit every time you eat or drink. Your vomiting gets worse, does not go away, or includes blood or green matter (bile). You are getting treatment but symptoms are getting worse. You have a fever. You have a severe headache. You have: Diarrhea that gets worse or does not go away. Blood in your stool. This may cause stool to look black and tarry. Not urinating, or urinating only a small amount of very dark urine, within 6-8 hours. You have trouble breathing. These symptoms may be an emergency. Get help right away. Do not wait to see if the symptoms will go away. Do not drive yourself to the hospital. Call 911.

## 2024-01-07 NOTE — Telephone Encounter (Signed)
 Spoke to pt and he was seen yesterday for his issue. Pt stated that he will call if he need to cancel his appt. For tomorrow due to his sx.

## 2024-01-08 ENCOUNTER — Ambulatory Visit: Payer: Self-pay | Admitting: Adult Health

## 2024-01-08 ENCOUNTER — Ambulatory Visit (INDEPENDENT_AMBULATORY_CARE_PROVIDER_SITE_OTHER): Admitting: Adult Health

## 2024-01-08 ENCOUNTER — Encounter: Payer: Self-pay | Admitting: Adult Health

## 2024-01-08 VITALS — BP 120/80 | HR 92 | Temp 98.3°F | Ht 73.0 in | Wt 203.0 lb

## 2024-01-08 DIAGNOSIS — Z23 Encounter for immunization: Secondary | ICD-10-CM

## 2024-01-08 DIAGNOSIS — F32A Depression, unspecified: Secondary | ICD-10-CM

## 2024-01-08 DIAGNOSIS — F1910 Other psychoactive substance abuse, uncomplicated: Secondary | ICD-10-CM | POA: Diagnosis not present

## 2024-01-08 DIAGNOSIS — R197 Diarrhea, unspecified: Secondary | ICD-10-CM | POA: Diagnosis not present

## 2024-01-08 DIAGNOSIS — Z0000 Encounter for general adult medical examination without abnormal findings: Secondary | ICD-10-CM

## 2024-01-08 DIAGNOSIS — F419 Anxiety disorder, unspecified: Secondary | ICD-10-CM | POA: Diagnosis not present

## 2024-01-08 LAB — LIPID PANEL
Cholesterol: 151 mg/dL (ref 0–200)
HDL: 41.4 mg/dL (ref >39.00)
LDL Cholesterol: 93 mg/dL (ref 0–99)
NonHDL: 109.34
Total CHOL/HDL Ratio: 4
Triglycerides: 80 mg/dL (ref 0.0–149.0)
VLDL: 16 mg/dL (ref 0.0–40.0)

## 2024-01-08 LAB — COMPREHENSIVE METABOLIC PANEL WITH GFR
ALT: 18 U/L (ref 0–53)
AST: 17 U/L (ref 0–37)
Albumin: 4.3 g/dL (ref 3.5–5.2)
Alkaline Phosphatase: 51 U/L (ref 39–117)
BUN: 8 mg/dL (ref 6–23)
CO2: 22 meq/L (ref 19–32)
Calcium: 9.2 mg/dL (ref 8.4–10.5)
Chloride: 102 meq/L (ref 96–112)
Creatinine, Ser: 0.71 mg/dL (ref 0.40–1.50)
GFR: 127.03 mL/min (ref >60.00)
Glucose, Bld: 83 mg/dL (ref 70–99)
Potassium: 3.8 meq/L (ref 3.5–5.1)
Sodium: 137 meq/L (ref 135–145)
Total Bilirubin: 0.3 mg/dL (ref 0.2–1.2)
Total Protein: 6.9 g/dL (ref 6.0–8.3)

## 2024-01-08 LAB — CBC
HCT: 41.9 % (ref 39.0–52.0)
Hemoglobin: 14 g/dL (ref 13.0–17.0)
MCHC: 33.5 g/dL (ref 30.0–36.0)
MCV: 81.3 fl (ref 78.0–100.0)
Platelets: 214 K/uL (ref 150.0–400.0)
RBC: 5.15 Mil/uL (ref 4.22–5.81)
RDW: 13.5 % (ref 11.5–15.5)
WBC: 6.3 K/uL (ref 4.0–10.5)

## 2024-01-08 LAB — TSH: TSH: 1.24 u[IU]/mL (ref 0.35–5.50)

## 2024-01-08 MED ORDER — BUPROPION HCL ER (XL) 150 MG PO TB24: 150.0000 mg | ORAL_TABLET | Freq: Every day | ORAL | 0 refills | Status: DC

## 2024-01-08 NOTE — Progress Notes (Signed)
 Subjective:    Patient ID: Kyle Woodward, male    DOB: March 27, 1996, 28 y.o.   MRN: [id number]  HPI Patient presents for yearly preventative medicine examination. He is a pleasant 28 year old male who  has a past medical history of Asthma, Bipolar 1 disorder (HCC), Depression, and Plaque psoriasis.  Polysubstance Abuse - has been sober for 6 years!   Anxiety and Depression - he has been having increased symptoms from being a new father and being the sole provider for his family. He feels as though his anxiety is pretty bad and is ready to ask for help. He does not have time in his schedule right now to see a therapist or psyhiatrist as he is working 60 hours a week. He is interested in medication.   Diarrhea -he reports that starting 3 days ago he had fevers, chills, diarrhea, abdominal pain and nausea but no vomiting.  That he may have eaten some questionable food in a restaurant in [facility].  Seen at urgent care the day after described as needed Zofran .  Today he reports that he continues to have diarrhea but is feeling as though he is improving overall.  All immunizations and health maintenance protocols were reviewed with the patient and needed orders were placed.  Appropriate screening laboratory values were ordered for the patient including screening of hyperlipidemia, renal function and hepatic function.  Medication reconciliation,  past medical history, social history, problem list and allergies were reviewed in detail with the patient  Goals were established with regard to weight loss, exercise, and  diet in compliance with medications Wt Readings from Last 3 Encounters:  01/08/24 203 lb (92.1 kg)  11/29/22 205 lb (93 kg)  09/22/20 180 lb (81.6 kg)    Review of Systems  Constitutional: Negative.   HENT: Negative.    Eyes: Negative.   Respiratory: Negative.    Cardiovascular: Negative.   Gastrointestinal:  Positive for abdominal pain and diarrhea.  Endocrine: Negative.    Genitourinary: Negative.   Musculoskeletal: Negative.   Skin: Negative.   Allergic/Immunologic: Negative.   Neurological: Negative.   Hematological: Negative.   Psychiatric/Behavioral:  Positive for decreased concentration and sleep disturbance. The patient is nervous/anxious.   All other systems reviewed and are negative.  Past Medical History:  Diagnosis Date   Asthma    Bipolar 1 disorder (HCC)    Depression    Plaque psoriasis     Social History   Socioeconomic History   Marital status: Significant Other    Spouse name: Not on file   Number of children: Not on file   Years of education: Not on file   Highest education level: Not on file  Occupational History   Not on file  Tobacco Use   Smoking status: Former    Current packs/day: 0.00    Types: Cigarettes    Quit date: 04/06/2018    Years since quitting: 5.7   Smokeless tobacco: Never  Vaping Use   Vaping status: Every Day  Substance and Sexual Activity   Alcohol use: Yes    Comment: socially   Drug use: Yes    Types: Marijuana, Heroin, Oxycodone, Codeine    Comment: occasionally   Sexual activity: Yes    Partners: Female  Other Topics Concern   Not on file  Social History Narrative   ** Merged History Encounter **       Social Drivers of Corporate investment banker Strain: Not on file  Food Insecurity: Not on file  Transportation Needs: Not on file  Physical Activity: Not on file  Stress: Not on file  Social Connections: Not on file  Intimate Partner Violence: Not on file    Past Surgical History:  Procedure Laterality Date   ADENOIDECTOMY     TONSILLECTOMY     WISDOM TOOTH EXTRACTION      Family History  Problem Relation Age of Onset   Diabetes Mother    Hypertension Mother    Alcohol abuse Mother    Depression Mother    Hyperlipidemia Mother    Alcohol abuse Father    Depression Father    Hypertension Father    Arthritis Maternal Grandmother    Hyperlipidemia Maternal  Grandmother    Hypertension Maternal Grandmother     Allergies  Allergen Reactions   Influenza Vaccines     Local reaction to injection site - swelling Overall feeling bad after getting the vaccine   Zithromax [Azithromycin] Hives    Current Outpatient Medications on File Prior to Visit  Medication Sig Dispense Refill   albuterol  (VENTOLIN  HFA) 108 (90 Base) MCG/ACT inhaler Inhale 2 puffs into the lungs every 4 (four) hours as needed for wheezing or shortness of breath. 1 each 0   loperamide  (IMODIUM ) 2 MG capsule Loperamide  2 mg, 1 pill, every 12 hours if needed for diarrhea.  Be cautious with use as constipation could occur quickly. 12 capsule 0   ondansetron  (ZOFRAN -ODT) 4 MG disintegrating tablet Take 1 tablet (4 mg total) by mouth every 8 (eight) hours as needed for nausea or vomiting. 20 tablet 0   No current facility-administered medications on file prior to visit.    BP 120/80   Pulse 92   Temp 98.3 F (36.8 C) (Oral)   Ht 6' 1 (1.854 m)   Wt 203 lb (92.1 kg)   SpO2 98%   BMI 26.78 kg/m        Objective:   Physical Exam Vitals and nursing note reviewed.  Constitutional:      General: He is not in acute distress.    Appearance: Normal appearance. He is not ill-appearing.  HENT:     Head: Normocephalic and atraumatic.     Right Ear: Tympanic membrane, ear canal and external ear normal. There is no impacted cerumen.     Left Ear: Tympanic membrane, ear canal and external ear normal. There is no impacted cerumen.     Nose: Nose normal. No congestion or rhinorrhea.     Mouth/Throat:     Mouth: Mucous membranes are moist.     Pharynx: Oropharynx is clear.  Eyes:     Extraocular Movements: Extraocular movements intact.     Conjunctiva/sclera: Conjunctivae normal.     Pupils: Pupils are equal, round, and reactive to light.  Neck:     Vascular: No carotid bruit.  Cardiovascular:     Rate and Rhythm: Normal rate and regular rhythm.     Pulses: Normal pulses.      Heart sounds: No murmur heard.    No friction rub. No gallop.  Pulmonary:     Effort: Pulmonary effort is normal.     Breath sounds: Normal breath sounds.  Abdominal:     General: Abdomen is flat. Bowel sounds are increased. There is no distension.     Palpations: Abdomen is soft. There is no mass.     Tenderness: There is no abdominal tenderness. There is no guarding or rebound.     Hernia:  No hernia is present.  Musculoskeletal:        General: Normal range of motion.     Cervical back: Normal range of motion and neck supple.  Lymphadenopathy:     Cervical: No cervical adenopathy.  Skin:    General: Skin is warm and dry.     Capillary Refill: Capillary refill takes less than 2 seconds.  Neurological:     General: No focal deficit present.     Mental Status: He is alert and oriented to person, place, and time.  Psychiatric:        Mood and Affect: Mood normal.        Behavior: Behavior normal.        Thought Content: Thought content normal.        Judgment: Judgment normal.        Assessment & Plan:  1. Routine general medical examination at a health care facility (Primary) Today patient counseled on age appropriate routine health concerns for screening and prevention, each reviewed and up to date or declined. Immunizations reviewed and up to date or declined. Labs ordered and reviewed. Risk factors for depression reviewed and negative. Hearing function and visual acuity are intact. ADLs screened and addressed as needed. Functional ability and level of safety reviewed and appropriate. Education, counseling and referrals performed based on assessed risks today. Patient provided with a copy of personalized plan for preventive services. - Encouraged to eat healthy and exercise - Follow up in one year or sooner if needed   2. Polysubstance abuse (HCC) - Congratulated on 6 years sober.  - Lipid panel; Future - TSH; Future - CBC; Future - Comprehensive metabolic panel with GFR;  Future  3. Anxiety and depression    01/08/2024   10:32 AM 11/29/2022    7:59 AM  PHQ9 SCORE ONLY  PHQ-9 Total Score 12 6      Data saved with a previous flowsheet row definition      01/08/2024   10:32 AM  GAD 7 : Generalized Anxiety Score  Nervous, Anxious, on Edge 3  Control/stop worrying 3  Worry too much - different things 3  Trouble relaxing 3  Restless 1  Easily annoyed or irritable 1  Afraid - awful might happen 2  Total GAD 7 Score 16  Anxiety Difficulty Very difficult     - Will place on Wellbutrin  150 mg XR daily. Follow up in 4-6 weeks.  - Lipid panel; Future - TSH; Future - CBC; Future - Comprehensive metabolic panel with GFR; Future  4. Diarrhea, unspecified type - Symptoms continue but improving. Food bourne illness vs viral gastroenteritis. Will have him keep me informed if diarrhea has not resolved in the next two days   5. Need for HPV vaccination  - HPV 9-valent vaccine,Recombinat   Darleene Shape, NP

## 2024-01-08 NOTE — Patient Instructions (Signed)
 It was great seeing you today   We will follow up with you regarding your lab work   Please let me know if you need anything   Follow up in 4-6 weeks for virtual visit

## 2024-01-09 NOTE — Telephone Encounter (Signed)
 FYI

## 2024-05-03 ENCOUNTER — Other Ambulatory Visit: Payer: Self-pay | Admitting: Adult Health

## 2024-05-04 ENCOUNTER — Encounter: Payer: Self-pay | Admitting: Adult Health

## 2024-05-05 ENCOUNTER — Telehealth: Payer: Self-pay | Admitting: *Deleted

## 2024-05-05 MED ORDER — BUPROPION HCL ER (XL) 150 MG PO TB24: 150.0000 mg | ORAL_TABLET | Freq: Every day | ORAL | 0 refills | Status: AC

## 2024-05-05 NOTE — Telephone Encounter (Signed)
 You wanted pt to follow up 4-6 weeks about medication. Ok to fill? Pt updated via mychart.

## 2024-05-05 NOTE — Telephone Encounter (Signed)
 Copied from CRM [id number]. Topic: Clinical - Medication Refill >> May 05, 2024  9:01 AM Frederich PARAS wrote: Medication: buPROPion  (WELLBUTRIN  XL) 150 MG 24 hr tablet  Has the patient contacted their pharmacy? Yes  This is the patient's preferred pharmacy:  [facility] [address] [place] - [address] [place] [address] Phone: [phone] Fax: [phone]  Is this the correct pharmacy for this prescription? Yes If no, delete pharmacy and type the correct one.   Has the prescription been filled recently? Yes  Is the patient out of the medication? Yes for a day or 2   Has the patient been seen for an appointment in the last year OR does the patient have an upcoming appointment? Yes  Can we respond through MyChart? Yes  Agent: Please be advised that Rx refills may take up to 3 business days. We ask that you follow-up with your pharmacy.

## 2024-05-05 NOTE — Telephone Encounter (Signed)
 Copied from CRM #[id number]. Topic: Clinical - Prescription Issue >> May 05, 2024  8:58 AM Frederich PARAS wrote: Reason for CRM: t calling in regards to buPROPion  (WELLBUTRIN  XL) 150 MG 24 hr tablet. The prescriptions in  pending state. Pt called back in to get more updates on whether or not he will get the medication. Please reac out to pt

## 2024-05-06 NOTE — Telephone Encounter (Signed)
Please see Mychart message for update.
